# Patient Record
Sex: Female | Born: 1995 | Hispanic: No | Marital: Single | Smoking: Never smoker
Health system: Southern US, Community
[De-identification: ages and names within clinical notes are randomized; demographics above are authoritative.]

---
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
## 2016-05-25 ENCOUNTER — Encounter: Payer: Self-pay | Admitting: Gynecology

## 2016-06-10 ENCOUNTER — Encounter (HOSPITAL_BASED_OUTPATIENT_CLINIC_OR_DEPARTMENT_OTHER): Payer: Self-pay | Admitting: Emergency Medicine

## 2016-06-10 ENCOUNTER — Emergency Department (HOSPITAL_BASED_OUTPATIENT_CLINIC_OR_DEPARTMENT_OTHER): Payer: No Typology Code available for payment source

## 2016-06-10 ENCOUNTER — Emergency Department (HOSPITAL_BASED_OUTPATIENT_CLINIC_OR_DEPARTMENT_OTHER)
Admission: EM | Admit: 2016-06-10 | Discharge: 2016-06-11 | Disposition: A | Discharge status: Home / Self Care | Payer: No Typology Code available for payment source | Attending: Emergency Medicine | Admitting: Emergency Medicine

## 2016-06-10 DIAGNOSIS — S1980XA Other specified injuries of unspecified part of neck, initial encounter: Secondary | ICD-10-CM | POA: Diagnosis not present

## 2016-06-10 DIAGNOSIS — Y939 Activity, unspecified: Secondary | ICD-10-CM | POA: Diagnosis not present

## 2016-06-10 DIAGNOSIS — Y92410 Unspecified street and highway as the place of occurrence of the external cause: Secondary | ICD-10-CM | POA: Diagnosis not present

## 2016-06-10 DIAGNOSIS — Y999 Unspecified external cause status: Secondary | ICD-10-CM | POA: Diagnosis not present

## 2016-06-10 DIAGNOSIS — S199XXA Unspecified injury of neck, initial encounter: Secondary | ICD-10-CM | POA: Diagnosis present

## 2016-06-10 LAB — PREGNANCY, URINE: Preg Test, Ur: NEGATIVE

## 2016-06-10 MED ORDER — IBUPROFEN 400 MG PO TABS
600.0000 mg | ORAL_TABLET | Freq: Once | ORAL | Status: AC
  Administered 2016-06-10: 600 mg via ORAL
  Filled 2016-06-10: qty 1
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
## 2016-06-11 MED ORDER — METHOCARBAMOL 500 MG PO TABS: 500.0000 mg | ORAL_TABLET | Freq: Two times a day (BID) | ORAL | 0 refills | Status: DC

## 2016-06-11 MED ORDER — IBUPROFEN 600 MG PO TABS: 600.0000 mg | ORAL_TABLET | Freq: Four times a day (QID) | ORAL | 0 refills | Status: DC | PRN

## 2016-06-11 MED ORDER — ACETAMINOPHEN 500 MG PO TABS: 500.0000 mg | ORAL_TABLET | Freq: Four times a day (QID) | ORAL | 0 refills | Status: DC | PRN
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
## 2016-06-11 NOTE — ED Notes (Signed)
Pt verbalizes understanding of d/c instructions and denies any further needs at this time. 

## 2016-06-13 ENCOUNTER — Encounter (HOSPITAL_COMMUNITY): Payer: Self-pay | Admitting: *Deleted

## 2016-06-13 ENCOUNTER — Emergency Department (HOSPITAL_COMMUNITY)
Admission: EM | Admit: 2016-06-13 | Discharge: 2016-06-17 | Disposition: A | Discharge status: Other Acute Inpatient Hospital | Payer: No Typology Code available for payment source | Attending: Emergency Medicine | Admitting: Emergency Medicine

## 2016-06-13 DIAGNOSIS — Z046 Encounter for general psychiatric examination, requested by authority: Secondary | ICD-10-CM | POA: Diagnosis not present

## 2016-06-13 DIAGNOSIS — F333 Major depressive disorder, recurrent, severe with psychotic symptoms: Secondary | ICD-10-CM | POA: Diagnosis not present

## 2016-06-13 DIAGNOSIS — F918 Other conduct disorders: Secondary | ICD-10-CM | POA: Diagnosis present

## 2016-06-13 DIAGNOSIS — Z79899 Other long term (current) drug therapy: Secondary | ICD-10-CM | POA: Diagnosis not present

## 2016-06-13 DIAGNOSIS — F323 Major depressive disorder, single episode, severe with psychotic features: Secondary | ICD-10-CM | POA: Diagnosis present

## 2016-06-13 DIAGNOSIS — R45851 Suicidal ideations: Secondary | ICD-10-CM | POA: Diagnosis not present

## 2016-06-13 LAB — ETHANOL: Alcohol, Ethyl (B): <5 mg/dL (ref <5)

## 2016-06-13 LAB — COMPREHENSIVE METABOLIC PANEL
ALT: 15 U/L (ref 14–54)
AST: 24 U/L (ref 15–41)
Albumin: 5.3 g/dL — ABNORMAL HIGH (ref 3.5–5.0)
Alkaline Phosphatase: 59 U/L (ref 38–126)
Anion gap: 12 (ref 5–15)
BUN: 17 mg/dL (ref 6–20)
CO2: 22 mmol/L (ref 22–32)
Calcium: 9.8 mg/dL (ref 8.9–10.3)
Chloride: 107 mmol/L (ref 101–111)
Creatinine, Ser: 0.83 mg/dL (ref 0.44–1.00)
GFR calc Af Amer: >60 mL/min (ref >60)
GFR calc non Af Amer: >60 mL/min (ref >60)
Glucose, Bld: 106 mg/dL — ABNORMAL HIGH (ref 65–99)
Potassium: 3.4 mmol/L — ABNORMAL LOW (ref 3.5–5.1)
Sodium: 141 mmol/L (ref 135–145)
Total Bilirubin: 2.7 mg/dL — ABNORMAL HIGH (ref 0.3–1.2)
Total Protein: 8.4 g/dL — ABNORMAL HIGH (ref 6.5–8.1)

## 2016-06-13 LAB — CBC
HCT: 42.6 % (ref 36.0–46.0)
Hemoglobin: 14.8 g/dL (ref 12.0–15.0)
MCH: 31.1 pg (ref 26.0–34.0)
MCHC: 34.7 g/dL (ref 30.0–36.0)
MCV: 89.5 fL (ref 78.0–100.0)
Platelets: 226 10*3/uL (ref 150–400)
RBC: 4.76 MIL/uL (ref 3.87–5.11)
RDW: 12.7 % (ref 11.5–15.5)
WBC: 6.8 10*3/uL (ref 4.0–10.5)

## 2016-06-13 LAB — RAPID URINE DRUG SCREEN, HOSP PERFORMED
Amphetamines: NOT DETECTED
Barbiturates: NOT DETECTED
Benzodiazepines: NOT DETECTED
Cocaine: NOT DETECTED
Opiates: NOT DETECTED
Tetrahydrocannabinol: NOT DETECTED

## 2016-06-13 LAB — SALICYLATE LEVEL: Salicylate Lvl: <7 mg/dL (ref 2.8–30.0)

## 2016-06-13 LAB — ACETAMINOPHEN LEVEL: Acetaminophen (Tylenol), Serum: <10 ug/mL — ABNORMAL LOW (ref 10–30)
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
## 2016-06-13 NOTE — ED Provider Notes (Signed)
[facility]-EMERGENCY DEPT Provider Note   CSN: [id number] Arrival date & time: 06/13/16  1623     History   Chief Complaint Chief Complaint  Patient presents with  . Medical Clearance    HPI Deborah Harris is a 21 y.o. female.  Per ivc paperwork, pt here under ivc by her mother due to inability to care for herself, agitation, and spitting on people Pt allegedly tried to get a knife to harm herself but was stopped  She states that she is responding to internal stimuli While attempting to get a direct hx from the patient she would only respond that she is ok      History reviewed. No pertinent past medical history.  Patient Active Problem List   Diagnosis Date Noted  . Acne 05/21/2012  . Ovarian cyst, right 02/27/2012    History reviewed. No pertinent surgical history.  OB History    Gravida Para Term Preterm AB Living   0             SAB TAB Ectopic Multiple Live Births                   Home Medications    Prior to Admission medications   Medication Sig Start Date End Date Taking? Authorizing Provider  acetaminophen (TYLENOL) 500 MG tablet Take 1 tablet (500 mg total) by mouth every 6 (six) hours as needed. 06/11/16   Law, Waylan BogaAlexandra M, PA-C  drospirenone-ethinyl estradiol (YASMIN,ZARAH,SYEDA) 3-0.03 MG tablet Take 1 tablet by mouth daily. 05/21/12   Ok EdwardsFernandez, Juan H, MD  ibuprofen (ADVIL,MOTRIN) 600 MG tablet Take 1 tablet (600 mg total) by mouth every 6 (six) hours as needed. 06/11/16   Law, Waylan BogaAlexandra M, PA-C  methocarbamol (ROBAXIN) 500 MG tablet Take 1 tablet (500 mg total) by mouth 2 (two) times daily. 06/11/16   Emi HolesLaw, Alexandra M, PA-C    Family History Family History  Problem Relation Age of Onset  . Diabetes Maternal Grandmother     Social History Social History  Substance Use Topics  . Smoking status: Never Smoker  . Smokeless tobacco: Never Used  . Alcohol use No     Allergies   Patient has no known allergies.   Review of Systems Review of Systems   Unable to perform ROS: Psychiatric disorder     Physical Exam Updated Vital Signs BP 140/85 (BP Location: Right Arm)   Pulse (!) 110   Temp 98.9 F (37.2 C) (Oral)   Resp 18   SpO2 96%   Physical Exam  Constitutional: She is oriented to person, place, and time. She appears well-developed and well-nourished.  Non-toxic appearance. No distress.  HENT:  Head: Normocephalic and atraumatic.  Eyes: Conjunctivae, EOM and lids are normal. Pupils are equal, round, and reactive to light.  Neck: Normal range of motion. Neck supple. No tracheal deviation present. No thyroid mass present.  Cardiovascular: Normal rate, regular rhythm and normal heart sounds.  Exam reveals no gallop.   No murmur heard. Pulmonary/Chest: Effort normal and breath sounds normal. No stridor. No respiratory distress. She has no decreased breath sounds. She has no wheezes. She has no rhonchi. She has no rales.  Abdominal: Soft. Normal appearance and bowel sounds are normal. She exhibits no distension. There is no tenderness. There is no rebound and no CVA tenderness.  Musculoskeletal: Normal range of motion. She exhibits no edema or tenderness.  Neurological: She is alert and oriented to person, place, and time. She has normal strength. No cranial  nerve deficit or sensory deficit. GCS eye subscore is 4. GCS verbal subscore is 5. GCS motor subscore is 6.  Skin: Skin is warm and dry. No abrasion and no rash noted.  Psychiatric: Her affect is inappropriate. Her speech is delayed. She is withdrawn. She expresses inappropriate judgment. She expresses no suicidal plans and no homicidal plans. She is inattentive.  Nursing note and vitals reviewed.    ED Treatments / Results  Labs (all labs ordered are listed, but only abnormal results are displayed) Labs Reviewed  COMPREHENSIVE METABOLIC PANEL  ETHANOL  SALICYLATE LEVEL  ACETAMINOPHEN LEVEL  CBC  RAPID URINE DRUG SCREEN, HOSP PERFORMED    EKG  EKG  Interpretation None       Radiology No results found.  Procedures Procedures (including critical care time)  Medications Ordered in ED Medications - No data to display   Initial Impression / Assessment and Plan / ED Course  I have reviewed the triage vital signs and the nursing notes.  Pertinent labs & imaging results that were available during my care of the patient were reviewed by me and considered in my medical decision making (see chart for details).     Medical screening labs are pending and pt will likely be medically cleared and then referred to [facility]  Final Clinical Impressions(s) / ED Diagnoses   Final diagnoses:  None    New Prescriptions New Prescriptions   No medications on file     Lorre Nick, MD 06/13/16 1701

## 2016-06-13 NOTE — ED Triage Notes (Signed)
Patient is brought in by [employer]. Patient's mother IVC patient because of her not eating, sleeping, and not taking care of her personal hygiene. Patient has been spitting on people and expressing self hate. Patient denies visual or auditory hallucinations. Patient says "I am ok". She is deferring questions and has a blank stare.

## 2016-06-14 DIAGNOSIS — F323 Major depressive disorder, single episode, severe with psychotic features: Secondary | ICD-10-CM | POA: Diagnosis not present

## 2016-06-14 DIAGNOSIS — R45851 Suicidal ideations: Secondary | ICD-10-CM

## 2016-06-14 LAB — I-STAT BETA HCG BLOOD, ED (MC, WL, AP ONLY): I-stat hCG, quantitative: <5 m[IU]/mL (ref <5)

## 2016-06-14 LAB — CBG MONITORING, ED: GLUCOSE-CAPILLARY: 71 mg/dL (ref 65–99)

## 2016-06-14 MED ORDER — LORAZEPAM 2 MG/ML IJ SOLN
1.0000 mg | INTRAMUSCULAR | Status: AC
  Administered 2016-06-14 (×2): 1 mg via INTRAVENOUS
  Filled 2016-06-14 (×2): qty 1

## 2016-06-14 MED ORDER — KCL IN DEXTROSE-NACL 20-5-0.45 MEQ/L-%-% IV SOLN
Freq: Once | INTRAVENOUS | Status: AC
  Administered 2016-06-14: 13:00:00 via INTRAVENOUS
  Filled 2016-06-14: qty 1000

## 2016-06-14 MED ORDER — RISPERIDONE 0.5 MG PO TABS
0.2500 mg | ORAL_TABLET | Freq: Two times a day (BID) | ORAL | Status: DC
  Filled 2016-06-14 (×2): qty 1

## 2016-06-14 MED ORDER — SODIUM CHLORIDE 0.9 % IV BOLUS (SEPSIS)
1000.0000 mL | Freq: Once | INTRAVENOUS | Status: AC
  Administered 2016-06-14: 1000 mL via INTRAVENOUS

## 2016-06-14 MED ORDER — LORAZEPAM 1 MG PO TABS: 1.0000 mg | ORAL_TABLET | ORAL | Status: DC

## 2016-06-14 MED ORDER — LORAZEPAM 2 MG/ML IJ SOLN: 2.0000 mg | INTRAMUSCULAR | Status: DC

## 2016-06-14 MED ORDER — SODIUM CHLORIDE 0.9 % IV SOLN: 1000.0000 mL | INTRAVENOUS | Status: DC

## 2016-06-14 MED ORDER — LORAZEPAM 1 MG PO TABS: 1.0000 mg | ORAL_TABLET | Freq: Four times a day (QID) | ORAL | Status: DC | PRN

## 2016-06-14 MED ORDER — CITALOPRAM HYDROBROMIDE 10 MG PO TABS
10.0000 mg | ORAL_TABLET | Freq: Every day | ORAL | Status: DC
  Administered 2016-06-17: 10 mg via ORAL
  Filled 2016-06-14 (×3): qty 1

## 2016-06-14 NOTE — ED Provider Notes (Signed)
Notified that pt is still not eating or drinking well.   Will order IV fluids considering the tachycardia.  Switch to d5 1/2 normal saline once the bolus is complete.   Linwood DibblesKnapp, Marvelous Woolford, MD 06/14/16 1038

## 2016-06-14 NOTE — ED Notes (Signed)
Pt became tearful in the room. When asked why she was crying, she said, "the curtain." When asked if she was upset that it was open, patient nodded her head in agreement. When this writer asked the patient why she wanted the curtain closed she said, "my sins." When asked what her sins were, patient did not answer. Patient stated her side hurt but could not tell me which one. Patient given sprite per request, tissues also given.
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
## 2016-06-14 NOTE — ED Notes (Signed)
Patient now talking sitting up in bed sipping water, refusing her medications.  Deborah MeansJamison Lord NP notified.  Patient reports the IV is making her feel like she is not in control of her body.

## 2016-06-15 LAB — URINALYSIS, ROUTINE W REFLEX MICROSCOPIC
Bilirubin Urine: NEGATIVE
Glucose, UA: NEGATIVE mg/dL
HGB URINE DIPSTICK: NEGATIVE
Ketones, ur: 5 mg/dL — AB
LEUKOCYTES UA: NEGATIVE
Nitrite: NEGATIVE
PROTEIN: NEGATIVE mg/dL
SPECIFIC GRAVITY, URINE: 1.005 (ref 1.005–1.030)
pH: 7 (ref 5.0–8.0)

## 2016-06-15 LAB — CBC WITH DIFFERENTIAL/PLATELET
BASOS ABS: 0 10*3/uL (ref 0.0–0.1)
Basophils Relative: 1 %
EOS ABS: 0.1 10*3/uL (ref 0.0–0.7)
EOS PCT: 1 %
HCT: 42.7 % (ref 36.0–46.0)
Hemoglobin: 14.9 g/dL (ref 12.0–15.0)
LYMPHS ABS: 1.8 10*3/uL (ref 0.7–4.0)
LYMPHS PCT: 32 %
MCH: 30.5 pg (ref 26.0–34.0)
MCHC: 34.9 g/dL (ref 30.0–36.0)
MCV: 87.5 fL (ref 78.0–100.0)
Monocytes Absolute: 0.5 10*3/uL (ref 0.1–1.0)
Monocytes Relative: 9 %
Neutro Abs: 3.2 10*3/uL (ref 1.7–7.7)
Neutrophils Relative %: 57 %
PLATELETS: 188 10*3/uL (ref 150–400)
RBC: 4.88 MIL/uL (ref 3.87–5.11)
RDW: 12.1 % (ref 11.5–15.5)
WBC: 5.5 10*3/uL (ref 4.0–10.5)

## 2016-06-15 LAB — BASIC METABOLIC PANEL
Anion gap: 9 (ref 5–15)
BUN: 10 mg/dL (ref 6–20)
CHLORIDE: 107 mmol/L (ref 101–111)
CO2: 22 mmol/L (ref 22–32)
Calcium: 9.1 mg/dL (ref 8.9–10.3)
Creatinine, Ser: 0.66 mg/dL (ref 0.44–1.00)
Glucose, Bld: 85 mg/dL (ref 65–99)
POTASSIUM: 3.5 mmol/L (ref 3.5–5.1)
SODIUM: 138 mmol/L (ref 135–145)

## 2016-06-15 MED ORDER — SODIUM CHLORIDE 0.9 % IV BOLUS (SEPSIS)
1000.0000 mL | Freq: Once | INTRAVENOUS | Status: AC
  Administered 2016-06-15: 1000 mL via INTRAVENOUS

## 2016-06-15 MED ORDER — DEXTROSE-NACL 5-0.45 % IV SOLN
INTRAVENOUS | Status: DC
  Administered 2016-06-16: 13:00:00 via INTRAVENOUS
  Administered 2016-06-16 – 2016-06-17 (×2): 1000 mL via INTRAVENOUS

## 2016-06-15 MED ORDER — RISPERIDONE 0.5 MG PO TABS
0.5000 mg | ORAL_TABLET | Freq: Two times a day (BID) | ORAL | Status: DC
  Administered 2016-06-17: 0.5 mg via ORAL
  Filled 2016-06-15 (×3): qty 1

## 2016-06-15 NOTE — ED Notes (Addendum)
Introduced self to patient. Pt oriented to unit expectations.  Assessed pt for:  A) Anxiety &/or agitation: On admission to the [facility] pt fell asleep again immediately and did not wake up when introductions were made.   S) Safety: Safety maintained with q-15-minute checks and hourly rounds by staff.  A) ADLs: Pt able to perform ADLs independently.  P) Pick-Up (room cleanliness): Pt's room clean and free of clutter.

## 2016-06-15 NOTE — ED Notes (Addendum)
SBAR Report received from previous nurse. Pt received unresponsive on unit. Pt gave no answers current SI/ HI, A/V H, depression, anxiety, or pain at this time, and gave it was reported to writer that pt has not eaten, drank, or been out of bed for any reason prior to shift change.  Pt did not respond to sternal rub, or nail bed pressure and only opened her eyes when staff repeatedly shined pen light into them. EDP notified and pt will be transferring back to TCU.

## 2016-06-15 NOTE — ED Notes (Signed)
Pt cleaned d/t menses.  Pt assisted with rolling when requested but eyes remained closed.

## 2016-06-15 NOTE — ED Notes (Signed)
Pt now with eyes open stating "I need to talk to [place].  She has my phone in her pocket."  Pt informed staff is not allowed to keep pt's personal items.  All personal items are locked up.  Pt attempting to come out of room stating "Can I go back to my other room and look."  Informed pt is not able to go back to previous room.  Pt redirected back into room.

## 2016-06-15 NOTE — ED Notes (Signed)
Pt in bed with eyes closed and not responding to verbal prompts. She did not eat lunch or dinner and has not been up to the bathroom. She did not resist, or move at all, when blood pressure cuff and pulse ox were put on her arm and finger. Skin is warm and dry. Rise and fall of chest is shallow requiring close examination and observation.

## 2016-06-15 NOTE — ED Provider Notes (Addendum)
[facility] that the patient is still catatonic. The common that she has been sway since early in the morning. She is incontinent of urine and not responding or eating on her own. When I evaluated her she opens her eyes and keeps them open a little bit but specifically looks away from me. She then closes them and opens them spontaneously. She does not respond to commands but is breathing normally. 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 Seems like she had a similar episode yesterday that required IV fluids. At this point, we'll give IV fluids and check lab work given the decreased oral intake.   Pricilla LovelessGoldston, Stefhanie Kachmar, MD 06/15/16 2016  Labwork unremarkable save for minimal urine ketones. Still not eating. Is awake but not eating. Will keep on fluids overnight, psych will need to reeval in AM. This appears to be psychiatric in origin   Pricilla LovelessGoldston, Kaly Mcquary, MD 06/15/16 2351

## 2016-06-15 NOTE — ED Notes (Signed)
Per TTS patient is being held while placement is being seeked.

## 2016-06-15 NOTE — Consult Note (Signed)
[facility] Psychiat[facility] Consult   Reason for Consult:  Bizarre behavior, psychosis and depressed Referring Physician:  EDP Patient Identification: Deborah Harris MRN:  [id number] Principal Diagnosis: Major depressive disorder with psychotic features [facility]) Diagnosis:   Patient Active Problem List   Diagnosis Date Noted  . Major depressive disorder with psychotic features ([place]) [F32.3] 06/14/2016    Priority: Medium  . Acne [L70.9] 05/21/2012  . Ovarian cyst, right [N83.201] 02/27/2012    Total Time spent with patient: 30 minutes  Subjective:   '' I don't want my family to come to see me at the hospital and if they do don't give them any information about me.''  Objective:  Patient was seen, interviewed, chart reviewed and plan discussed with the treatment team. Patient's behavior remains bizarre, has not been taking care of personal hygiene, getting easily agitated and irritable. Patient reports worsening depression, feeling hopeless and hearing voices telling her about her past mistakes. Patient now is now eating but she remains withdrawn and self isolated.  Past Psychiatric History:   Risk to Self: Suicidal Ideation: No Suicidal Intent: No Is patient at risk for suicide?: Yes Suicidal Plan?: No Access to Means: No What has been your use of drugs/alcohol within the last 12 months?: Unknown.  UDS and BAL clear How many times?:  (Unknown really.) Other Self Harm Risks: Not eating. Triggers for Past Attempts: Unknown Intentional Self Injurious Behavior: Damaging (Pt has not been eating, sleeping or taking care of herself.) Comment - Self Injurious Behavior: Unknown Risk to Others: Homicidal Ideation: No Thoughts of Harm to Others: No Current Homicidal Intent: No Current Homicidal Plan: No Access to Homicidal Means: No Identified Victim: No one History of harm to others?: Yes Assessment of Violence: On admission Violent Behavior Description: Spitting and pushing people  trying to help her. Does patient have access to weapons?: No Criminal Charges Pending?: No Does patient have a court date: No Prior Inpatient Therapy: Prior Inpatient Therapy:  (Unknown) Prior Therapy Dates: Unknown Prior Therapy Facilty/Provider(s): Unknown Reason for Treatment: Unknown Prior Outpatient Therapy: Prior Outpatient Therapy:  (Unknown) Prior Therapy Dates: Unknown Prior Therapy Facilty/Provider(s): Unknown Reason for Treatment: Unknown Does patient have an ACCT team?: Unknown Does patient have Intensive In-House Services?  : Unknown Does patient have [facility] services? : Unknown Does patient have P4CC services?: Unknown  Past Medical History: History reviewed. No pertinent past medical history. History reviewed. No pertinent surgical history. Family History:  Family History  Problem Relation Age of Onset  . Diabetes Maternal Grandmother    Family Psychiatric  History: Social History:  History  Alcohol Use No     History  Drug Use No    Social History   Social History  . Marital status: Single    Spouse name: N/A  . Number of children: N/A  . Years of education: N/A   Social History Main Topics  . Smoking status: Never Smoker  . Smokeless tobacco: Never Used  . Alcohol use No  . Drug use: No  . Sexual activity: Yes   Other Topics Concern  . None   Social History Narrative  . None   Additional Social History:    Allergies:  No Known Allergies  Labs:  Results for orders placed or performed during the hospital encounter of 06/13/16 (from the past 48 hour(s))  Comprehensive metabolic panel     Status: Abnormal   Collection Time: 06/13/16  4:34 PM  Result Value Ref Range   Sodium 141 135 - 145  mmol/L   Potassium 3.4 (L) 3.5 - 5.1 mmol/L   Chloride 107 101 - 111 mmol/L   CO2 22 22 - 32 mmol/L   Glucose, Bld 106 (H) 65 - 99 mg/dL   BUN 17 6 - 20 mg/dL   Creatinine, Ser 0.83 0.44 - 1.00 mg/dL   Calcium 9.8 8.9 - 10.3 mg/dL   Total Protein 8.4  (H) 6.5 - 8.1 g/dL   Albumin 5.3 (H) 3.5 - 5.0 g/dL   AST 24 15 - 41 U/L   ALT 15 14 - 54 U/L   Alkaline Phosphatase 59 38 - 126 U/L   Total Bilirubin 2.7 (H) 0.3 - 1.2 mg/dL   GFR calc non Af Amer >60 >60 mL/min   GFR calc Af Amer >60 >60 mL/min    Comment: (NOTE) The eGFR has been calculated using the CKD EPI equation. This calculation has not been validated in all clinical situations. eGFR's persistently <60 mL/min signify possible Chronic Kidney Disease.    Anion gap 12 5 - 15  Ethanol     Status: None   Collection Time: 06/13/16  4:34 PM  Result Value Ref Range   Alcohol, Ethyl (B) <5 <5 mg/dL    Comment:        LOWEST DETECTABLE LIMIT FOR SERUM ALCOHOL IS 5 mg/dL FOR MEDICAL PURPOSES ONLY   Salicylate level     Status: None   Collection Time: 06/13/16  4:34 PM  Result Value Ref Range   Salicylate Lvl <2.7 2.8 - 30.0 mg/dL  Acetaminophen level     Status: Abnormal   Collection Time: 06/13/16  4:34 PM  Result Value Ref Range   Acetaminophen (Tylenol), Serum <10 (L) 10 - 30 ug/mL    Comment:        THERAPEUTIC CONCENTRATIONS VARY SIGNIFICANTLY. A RANGE OF 10-30 ug/mL MAY BE AN EFFECTIVE CONCENTRATION FOR MANY PATIENTS. HOWEVER, SOME ARE BEST TREATED AT CONCENTRATIONS OUTSIDE THIS RANGE. ACETAMINOPHEN CONCENTRATIONS >150 ug/mL AT 4 HOURS AFTER INGESTION AND >50 ug/mL AT 12 HOURS AFTER INGESTION ARE OFTEN ASSOCIATED WITH TOXIC REACTIONS.   cbc     Status: None   Collection Time: 06/13/16  4:34 PM  Result Value Ref Range   WBC 6.8 4.0 - 10.5 K/uL   RBC 4.76 3.87 - 5.11 MIL/uL   Hemoglobin 14.8 12.0 - 15.0 g/dL   HCT 42.6 36.0 - 46.0 %   MCV 89.5 78.0 - 100.0 fL   MCH 31.1 26.0 - 34.0 pg   MCHC 34.7 30.0 - 36.0 g/dL   RDW 12.7 11.5 - 15.5 %   Platelets 226 150 - 400 K/uL  Rapid urine drug screen (hospital performed)     Status: None   Collection Time: 06/13/16  4:34 PM  Result Value Ref Range   Opiates NONE DETECTED NONE DETECTED   Cocaine NONE DETECTED  NONE DETECTED   Benzodiazepines NONE DETECTED NONE DETECTED   Amphetamines NONE DETECTED NONE DETECTED   Tetrahydrocannabinol NONE DETECTED NONE DETECTED   Barbiturates NONE DETECTED NONE DETECTED    Comment:        DRUG SCREEN FOR MEDICAL PURPOSES ONLY.  IF CONFIRMATION IS NEEDED FOR ANY PURPOSE, NOTIFY LAB WITHIN 5 DAYS.        LOWEST DETECTABLE LIMITS FOR URINE DRUG SCREEN Drug Class       Cutoff (ng/mL) Amphetamine      1000 Barbiturate      200 Benzodiazepine   035 Tricyclics       009  Opiates          300 Cocaine          300 THC              50   CBG monitoring, ED     Status: None   Collection Time: 06/14/16 10:35 AM  Result Value Ref Range   Glucose-Capillary 71 65 - 99 mg/dL   Comment 1 Notify RN    Comment 2 Document in Chart   I-Stat Beta hCG blood, ED ([facility], [facility], [facility] only)     Status: None   Collection Time: 06/14/16 11:05 AM  Result Value Ref Range   I-stat hCG, quantitative <5.0 <5 mIU/mL   Comment 3            Comment:   GEST. AGE      CONC.  (mIU/mL)   <=1 WEEK        5 - 50     2 WEEKS       50 - 500     3 WEEKS       100 - 10,000     4 WEEKS     1,000 - 30,000        FEMALE AND NON-PREGNANT FEMALE:     LESS THAN 5 mIU/mL     Current Facility-Administered Medications  Medication Dose Route Frequency Provider Last Rate Last Dose  . citalopram (CELEXA) tablet 10 mg  10 mg Oral Daily Kamarrion Stfort, MD      . LORazepam (ATIVAN) tablet 1 mg  1 mg Oral Q6H PRN Sharla Tankard, MD      . risperiDONE (RISPERDAL) tablet 0.25 mg  0.25 mg Oral BID Corena Pilgrim, MD       No current outpatient prescriptions on file.    Musculoskeletal: Strength & Muscle Tone: within normal limits Gait & Station: unable to stand Patient leans: N/A  Psychiatric Specialty Exam: Physical Exam  Psychiatric: Her affect is blunt. She is withdrawn and actively hallucinating. Cognition and memory are normal. She expresses impulsivity. She exhibits a depressed mood. She  expresses suicidal ideation. She is noncommunicative.    Review of Systems  Constitutional: Positive for malaise/fatigue and weight loss.  HENT: Negative.   Eyes: Negative.   Respiratory: Negative.   Cardiovascular: Negative.   Musculoskeletal: Negative.   Skin: Negative.   Neurological: Negative.   Endo/Heme/Allergies: Negative.   Psychiatric/Behavioral: Positive for depression, hallucinations and suicidal ideas. The patient is nervous/anxious.     Blood pressure (!) 104/57, pulse (!) 109, temperature 99.6 F (37.6 C), temperature source Oral, resp. rate 20, SpO2 99 %.There is no height or weight on file to calculate BMI.  General Appearance: Casual  Eye Contact:  Poor  Speech:  non-communicative  Volume:  Decreased  Mood:  Depressed, Dysphoric and Hopeless  Affect:  Constricted  Thought Process:  Disorganized  Orientation:  Other:  unable to assess  Thought Content:  Hallucinations: Auditory  Suicidal Thoughts:  Yes.  with intent/plan  Homicidal Thoughts:  No  Memory:  unable to assess  Judgement:  Poor  Insight:  Lacking  Psychomotor Activity:  Psychomotor Retardation  Concentration:  Concentration: Poor and Attention Span: Poor  Recall:  unable to assess  Fund of Knowledge:  unable to assess  Language:  Good  Akathisia:  No  Handed:  Right  AIMS (if indicated):     Assets:  Social Support  ADL's:  Impaired  Cognition:  WNL  Sleep:   poor  Treatment Plan Summary: Daily contact with patient to assess and evaluate symptoms and progress in treatment and Medication management  Continue  Celexa 10 mg daily for depression, Risperdal 0.25 mg bid and Lorazepam 1 mg q6h prn for agitation.  Disposition: Recommend psychiatric Inpatient admission when medically cleared.  Corena Pilgrim, MD 06/15/2016 11:31 AM

## 2016-06-15 NOTE — ED Notes (Signed)
Dr. Criss AlvineGoldston informed pt is on her cycle.  Received verbal order for in and out cath.

## 2016-06-15 NOTE — Progress Notes (Signed)
06/15/16 1345:  LRT went to pt room to offer activities, pt was sleep.  Caroll RancherMarjette Ferris Tally, LRT/CTRS

## 2016-06-15 NOTE — BH Assessment (Signed)
[facility] Assessment Progress Note  Per Thedore MinsMojeed Akintayo, MD, this pt requires psychiatric hospitalization.  Deborah Heinrichina Tate, RN, [facility]; they will be ready to receive pt at 15:00.  Pt presents under IVC initiated by her mother, and upheld by Dr Jannifer FranklinAkintayo, and [facility]  Pt's nurse, Diane, has been notified, and agrees to call report to [phone].  Pt is to be transported via [employer].   [name], MA Triage Specialist [phone]

## 2016-06-15 NOTE — ED Notes (Signed)
Pt was sleeping so deeply that we could not see her chest rise and fall. This Clinical research associatewriter shook her arm until she opened her eyes. She appeared to be disoriented. She insisted that this Clinical research associatewriter is not her nurse, that her nurse is Aram BeechamCynthia and did not seem able to comprehend otherwise. She could not seem to comprehend explanation about HIPPA, so her family will not be told anything [facility].

## 2016-06-15 NOTE — ED Notes (Signed)
When cleaning pt it was noted the blood is menstrual.

## 2016-06-15 NOTE — ED Notes (Addendum)
Pt stated "I don't need the medicine.  I'm good right now.   I've been having a hard time recently with my faith.  I've repented.  I didn't tell anyone but my boyfriend."

## 2016-06-15 NOTE — ED Notes (Signed)
Pt did not eat lunch or dinner; she has not been up to the bathroom.

## 2016-06-15 NOTE — ED Notes (Signed)
Pt out of room stating "I want to go to another restroom."  Pt directed to her BR.  Pt stated "that BR's dirty."  Informed pt, room & BR was cleaned prior to her arrival.  Pt escorted to hallway BR.  Pt did not use it and stated "I can't go to the BR right now."  Pt offered food.  Pt stated "I ate earlier.  No thanks."

## 2016-06-15 NOTE — ED Notes (Signed)
Mother at bedside wanting information regarding daughter's status. Per daughter she does not want any information released to the mother. Mother began crying and stated she just wants to know what the doctor said and attempted to touch and console the patient. Patient verbalized "Please do not put your hands on me mother and stop crying, why cry now when you wasn't before." patient looked at me and stated "nurse she can leave now." Patient was able to verbalize appropriate name, dob, and her location.

## 2016-06-16 DIAGNOSIS — R45851 Suicidal ideations: Secondary | ICD-10-CM | POA: Diagnosis not present

## 2016-06-16 DIAGNOSIS — F333 Major depressive disorder, recurrent, severe with psychotic symptoms: Secondary | ICD-10-CM

## 2016-06-16 MED ORDER — LORAZEPAM 2 MG/ML IJ SOLN
1.0000 mg | INTRAMUSCULAR | Status: AC
  Administered 2016-06-16 (×2): 1 mg via INTRAVENOUS
  Filled 2016-06-16 (×2): qty 1

## 2016-06-16 NOTE — ED Notes (Signed)
Grandmother at bedside.  Pt calm.

## 2016-06-16 NOTE — ED Provider Notes (Signed)
I was called to the bedside because patient was "catatonic." On exam patient is resting bed. She is arousable to gentle tactile stimulus. She follows simple commands and gets up and walks around the bed on command without difficulty on asking how she feels she states "ready to go" no further intervention needed presently   Doug SouJacubowitz, Ever Halberg, MD 06/16/16 [phone]

## 2016-06-16 NOTE — Consult Note (Signed)
[facility] Consult   Reason for Consult:  Depression with suicide attempt Referring Physician:  EDP Patient Identification: Deborah Harris MRN:  [id number] Principal Diagnosis: Major depressive disorder with psychotic features [facility]) Diagnosis:   Patient Active Problem List   Diagnosis Date Noted  . Major depressive disorder with psychotic features ([place]) [F32.3] 06/14/2016    Priority: High  . Acne [L70.9] 05/21/2012  . Ovarian cyst, right [N83.201] 02/27/2012    Total Time spent with patient: 45 minutes  Subjective:   Deborah Harris is a 21 y.o. female patient admitted with depression, suicide attempt, catatonia  HPI:  21 yo female who presented to the ED with depression and agitation, initially.  She evidently got agitated at home and they brought her to the ED.  Here she goes in and out of catatonia, minimal eating and drinking.  Stares often in space, slow to respond.  No homicidal ideations or alcohol/drug abuse.  Believes her behaviors are a result of her confessing her sin to God and her boyfriend.  Inpatient psychiatric care needed.  Past Psychiatric History: none  Risk to Self: Suicidal Ideation: No Suicidal Intent: No Is patient at risk for suicide?: Yes Suicidal Plan?: No Access to Means: No What has been your use of drugs/alcohol within the last 12 months?: Unknown.  UDS and BAL clear How many times?:  (Unknown really.) Other Self Harm Risks: Not eating. Triggers for Past Attempts: Unknown Intentional Self Injurious Behavior: Damaging (Pt has not been eating, sleeping or taking care of herself.) Comment - Self Injurious Behavior: Unknown Risk to Others: Homicidal Ideation: No Thoughts of Harm to Others: No Current Homicidal Intent: No Current Homicidal Plan: No Access to Homicidal Means: No Identified Victim: No one History of harm to others?: Yes Assessment of Violence: On admission Violent Behavior Description: Spitting and pushing people trying  to help her. Does patient have access to weapons?: No Criminal Charges Pending?: No Does patient have a court date: No Prior Inpatient Therapy: Prior Inpatient Therapy:  (Unknown) Prior Therapy Dates: Unknown Prior Therapy Facilty/Provider(s): Unknown Reason for Treatment: Unknown Prior Outpatient Therapy: Prior Outpatient Therapy:  (Unknown) Prior Therapy Dates: Unknown Prior Therapy Facilty/Provider(s): Unknown Reason for Treatment: Unknown Does patient have an ACCT team?: Unknown Does patient have Intensive In-House Services?  : Unknown Does patient have [facility] services? : Unknown Does patient have P4CC services?: Unknown  Past Medical History: History reviewed. No pertinent past medical history. History reviewed. No pertinent surgical history. Family History:  Family History  Problem Relation Age of Onset  . Diabetes Maternal Grandmother    Family Psychiatric  History: unknown Social History:  History  Alcohol Use No     History  Drug Use No    Social History   Social History  . Marital status: Single    Spouse name: N/A  . Number of children: N/A  . Years of education: N/A   Social History Main Topics  . Smoking status: Never Smoker  . Smokeless tobacco: Never Used  . Alcohol use No  . Drug use: No  . Sexual activity: Yes   Other Topics Concern  . None   Social History Narrative  . None   Additional Social History:    Allergies:  No Known Allergies  Labs:  Results for orders placed or performed during the hospital encounter of 06/13/16 (from the past 48 hour(s))  Basic metabolic panel     Status: None   Collection Time: 06/15/16  9:30 PM  Result Value  Ref Range   Sodium 138 135 - 145 mmol/L   Potassium 3.5 3.5 - 5.1 mmol/L   Chloride 107 101 - 111 mmol/L   CO2 22 22 - 32 mmol/L   Glucose, Bld 85 65 - 99 mg/dL   BUN 10 6 - 20 mg/dL   Creatinine, Ser 0.66 0.44 - 1.00 mg/dL   Calcium 9.1 8.9 - 10.3 mg/dL   GFR calc non Af Amer >60 >60 mL/min    GFR calc Af Amer >60 >60 mL/min    Comment: (NOTE) The eGFR has been calculated using the CKD EPI equation. This calculation has not been validated in all clinical situations. eGFR's persistently <60 mL/min signify possible Chronic Kidney Disease.    Anion gap 9 5 - 15  CBC with Differential     Status: None   Collection Time: 06/15/16  9:30 PM  Result Value Ref Range   WBC 5.5 4.0 - 10.5 K/uL   RBC 4.88 3.87 - 5.11 MIL/uL   Hemoglobin 14.9 12.0 - 15.0 g/dL   HCT 42.7 36.0 - 46.0 %   MCV 87.5 78.0 - 100.0 fL   MCH 30.5 26.0 - 34.0 pg   MCHC 34.9 30.0 - 36.0 g/dL   RDW 12.1 11.5 - 15.5 %   Platelets 188 150 - 400 K/uL   Neutrophils Relative % 57 %   Neutro Abs 3.2 1.7 - 7.7 K/uL   Lymphocytes Relative 32 %   Lymphs Abs 1.8 0.7 - 4.0 K/uL   Monocytes Relative 9 %   Monocytes Absolute 0.5 0.1 - 1.0 K/uL   Eosinophils Relative 1 %   Eosinophils Absolute 0.1 0.0 - 0.7 K/uL   Basophils Relative 1 %   Basophils Absolute 0.0 0.0 - 0.1 K/uL  Urinalysis, Routine w reflex microscopic     Status: Abnormal   Collection Time: 06/15/16 10:04 PM  Result Value Ref Range   Color, Urine STRAW (A) YELLOW   APPearance CLEAR CLEAR   Specific Gravity, Urine 1.005 1.005 - 1.030   pH 7.0 5.0 - 8.0   Glucose, UA NEGATIVE NEGATIVE mg/dL   Hgb urine dipstick NEGATIVE NEGATIVE   Bilirubin Urine NEGATIVE NEGATIVE   Ketones, ur 5 (A) NEGATIVE mg/dL   Protein, ur NEGATIVE NEGATIVE mg/dL   Nitrite NEGATIVE NEGATIVE   Leukocytes, UA NEGATIVE NEGATIVE    Current Facility-Administered Medications  Medication Dose Route Frequency Provider Last Rate Last Dose  . citalopram (CELEXA) tablet 10 mg  10 mg Oral Daily Naveed Humphres, MD      . dextrose 5 %-0.45 % sodium chloride infusion   Intravenous Continuous Sherwood Gambler, MD 75 mL/hr at 06/16/16 0859    . LORazepam (ATIVAN) injection 1 mg  1 mg Intravenous Q2H Patrecia Pour, NP   1 mg at 06/16/16 1045  . LORazepam (ATIVAN) tablet 1 mg  1 mg Oral  Q6H PRN Robinson Brinkley, MD      . risperiDONE (RISPERDAL) tablet 0.5 mg  0.5 mg Oral BID Patrecia Pour, NP       No current outpatient prescriptions on file.    Musculoskeletal: Strength & Muscle Tone: within normal limits Gait & Station: normal Patient leans: N/A  Psychiatric Specialty Exam: Physical Exam  Constitutional: She is oriented to person, place, and time. She appears well-developed and well-nourished.  HENT:  Head: Normocephalic.  Neck: Normal range of motion.  Respiratory: Effort normal.  Musculoskeletal: Normal range of motion.  Neurological: She is alert and oriented to person, place,  and time.  Psychiatric: Judgment normal. Her affect is blunt. Her speech is delayed. She is slowed and withdrawn. Cognition and memory are impaired. She exhibits a depressed mood. She expresses suicidal ideation. She expresses suicidal plans.    Review of Systems  Psychiatric/Behavioral: Positive for depression, hallucinations and suicidal ideas.  All other systems reviewed and are negative.   Blood pressure 112/66, pulse 75, temperature 98.9 F (37.2 C), temperature source Oral, resp. rate 18, SpO2 100 %.There is no height or weight on file to calculate BMI.  General Appearance: Casual  Eye Contact:  Fair  Speech:  Slow  Volume:  Decreased  Mood:  Depressed  Affect:  Depressed and Flat  Thought Process:  Irrelevant  Orientation:  Other:  person  Thought Content:  Delusions and Hallucinations: Auditory  Suicidal Thoughts:  Yes.  with intent/plan  Homicidal Thoughts:  No  Memory:  Immediate;   Fair Recent;   Fair Remote;   Fair  Judgement:  Impaired  Insight:  Lacking  Psychomotor Activity:  Decreased  Concentration:  Concentration: Fair and Attention Span: Fair  Recall:  [employer] of Knowledge:  Fair  Language:  Good  Akathisia:  No  Handed:  Right  AIMS (if indicated):     Assets:  Housing Leisure Time Physical Health Resilience Social Support  ADL's:  Intact   Cognition:  Impaired,  Moderate  Sleep:        Treatment Plan Summary: Daily contact with patient to assess and evaluate symptoms and progress in treatment, Medication management and Plan major depressive disorder, single episode, severe with psychosis:  -Crisis stabilization -Medication management:  Started Celexa 10 mg daily for depression, Risperdal 0.5 mg BID for psychosis, and Ativan 1 mg every two hours (4 doses) for catatonia -Individual counseling  Disposition: Recommend psychiatric Inpatient admission when medically cleared.  Waylan Boga, NP 06/16/2016 11:08 AM  Patient seen face-to-face for psychiatric evaluation, chart reviewed and case discussed with the physician extender and developed treatment plan. Reviewed the information documented and agree with the treatment plan. Corena Pilgrim, MD

## 2016-06-16 NOTE — ED Notes (Signed)
Pt lying on back with eyes semi-closed.  Sternal rub given without response from pt.  Pt's rise and fall of chest noted.  IVF continues infusing via pump.

## 2016-06-16 NOTE — ED Notes (Signed)
Pt's boyfriend in with pt.

## 2016-06-16 NOTE — ED Notes (Signed)
Pt eating and drinking on her own.  She states "when can I go home?  If I eat and drink, can I go home?"

## 2016-06-16 NOTE — ED Notes (Signed)
Boyfriend at bedside.  Pt calm.

## 2016-06-16 NOTE — ED Notes (Signed)
Pt encouraged to eat/drink.  Food/drink offered; pt refused.

## 2016-06-16 NOTE — ED Notes (Signed)
Pt appropriate with ED MD.  When asked to stand up, she complied.  RN offered nutrition and pt stated "yes, im thirsty".  Toileting offered.  Pt walked to bathroom.

## 2016-06-17 ENCOUNTER — Inpatient Hospital Stay (HOSPITAL_COMMUNITY)
Admission: AD | Admit: 2016-06-17 | Discharge: 2016-06-26 | DRG: 885 | Disposition: A | Discharge status: Home / Self Care | Payer: No Typology Code available for payment source | Attending: Psychiatry | Admitting: Psychiatry

## 2016-06-17 ENCOUNTER — Encounter (HOSPITAL_COMMUNITY): Payer: Self-pay | Admitting: *Deleted

## 2016-06-17 DIAGNOSIS — F333 Major depressive disorder, recurrent, severe with psychotic symptoms: Secondary | ICD-10-CM | POA: Diagnosis present

## 2016-06-17 DIAGNOSIS — R45851 Suicidal ideations: Secondary | ICD-10-CM | POA: Diagnosis not present

## 2016-06-17 DIAGNOSIS — F419 Anxiety disorder, unspecified: Secondary | ICD-10-CM | POA: Diagnosis present

## 2016-06-17 DIAGNOSIS — G4700 Insomnia, unspecified: Secondary | ICD-10-CM | POA: Diagnosis present

## 2016-06-17 DIAGNOSIS — Z79899 Other long term (current) drug therapy: Secondary | ICD-10-CM

## 2016-06-17 DIAGNOSIS — F323 Major depressive disorder, single episode, severe with psychotic features: Secondary | ICD-10-CM | POA: Diagnosis present

## 2016-06-17 DIAGNOSIS — F2081 Schizophreniform disorder: Secondary | ICD-10-CM

## 2016-06-17 MED ORDER — ACETAMINOPHEN 325 MG PO TABS: 650.0000 mg | ORAL_TABLET | Freq: Four times a day (QID) | ORAL | Status: DC | PRN

## 2016-06-17 MED ORDER — ALUM & MAG HYDROXIDE-SIMETH 200-200-20 MG/5ML PO SUSP: 30.0000 mL | ORAL | Status: DC | PRN

## 2016-06-17 MED ORDER — MAGNESIUM HYDROXIDE 400 MG/5ML PO SUSP: 30.0000 mL | Freq: Every day | ORAL | Status: DC | PRN

## 2016-06-17 MED ORDER — LORAZEPAM 1 MG PO TABS: 1.0000 mg | ORAL_TABLET | Freq: Four times a day (QID) | ORAL | Status: DC | PRN

## 2016-06-17 MED ORDER — RISPERIDONE 0.5 MG PO TABS
0.5000 mg | ORAL_TABLET | Freq: Two times a day (BID) | ORAL | Status: DC
  Administered 2016-06-18 (×2): 0.5 mg via ORAL
  Filled 2016-06-17 (×7): qty 1

## 2016-06-17 MED ORDER — CITALOPRAM HYDROBROMIDE 10 MG PO TABS
10.0000 mg | ORAL_TABLET | Freq: Every day | ORAL | Status: DC
  Administered 2016-06-17 – 2016-06-20 (×4): 10 mg via ORAL
  Filled 2016-06-17 (×6): qty 1

## 2016-06-17 MED ORDER — TRAZODONE HCL 50 MG PO TABS
50.0000 mg | ORAL_TABLET | Freq: Every evening | ORAL | Status: DC | PRN
  Administered 2016-06-22: 50 mg via ORAL
  Filled 2016-06-17: qty 1
  Filled 2016-06-17: qty 7

## 2016-06-17 NOTE — Progress Notes (Signed)
  DATA ACTION RESPONSE  Objective- Pt. is visible in the room, seen resting in bed with eyes open. Presents with a flat affect and mood with an intense stare. Appears paranoid/suspicious of staff.  No further c/o. No abnormal s/s.  Subjective- Denies having any SI/HI/AVH/Pain at this time. Pt. states "No, I am good". Remains safe on the unit.  1:1 interaction in private to establish rapport. Encouragement, education, & support given from staff.    Safety maintained with Q 15 checks. Continue with POC.

## 2016-06-17 NOTE — BH Assessment (Addendum)
Dr. Jannifer FranklinAkintayo and Hillery Jacksanika Lewis, NP inpatient treatment is recommended. [facility] Patient is under IVC. Pending [employer] transport.

## 2016-06-17 NOTE — ED Notes (Signed)
Patient is very pleasant today. Engaging in conversations and talking about getting better so she can get out of here and see the sunshine in a few days. She agreed and willingly took medications this morning. Also ate 100% of breakfast and request additional to drink.

## 2016-06-17 NOTE — Progress Notes (Signed)
Admission Note:  [age] year female who presents IVC, in no acute distress, for self-neglect. Patient appears flat and depressed.  Patient was calm and cooperative with admission process. Patient reports that she is being admitted because "my mom and sister are concerned that I wasn't eating enough and wasn't communicating enough".  Patient reports self-neglect was triggered by an event. Patient refuses to discuss the event and states that she will only discuss event with a psychiatrist.  Patient denies SI and contracts for safety upon admission. Patient denies AVH. Patient denies any significant health hx.  Patient is unable to identify a support system. Patient states "I don't ever want to be a burden by talking to my boyfriend and family about my problems".  Patient currently lives with her mother.  [facility], patient would like to "go home", "have a clear mind of how to walk through life", "Be more confident in myself", and "Not worry about what my past was".  Skin was assessed and found to be clear of any abnormal marks.  Patient searched and no contraband found, POC and unit policies explained and understanding verbalized. Consents obtained.  Patient had no additional questions or concerns.

## 2016-06-17 NOTE — ED Notes (Signed)
Pt compliant and calm. Pt [facility]. She is confirmed understanding with teachback method. She continues to ask when she can be signed out to go home but it pleasant in all interactions.

## 2016-06-17 NOTE — Tx Team (Signed)
Initial Treatment Plan 06/17/2016 4:20 PM Alla GermanDaniela Prowell AVW:[id number]    PATIENT STRESSORS: Financial difficulties Traumatic event   PATIENT STRENGTHS: Ability for insight Average or above average intelligence Communication skills   PATIENT IDENTIFIED PROBLEMS: Ineffective Coping  "Go Home"  "Have a clear mind of how to walk through life"  "Be more confident in myself"  "not worry about what my past was"             DISCHARGE CRITERIA:  Ability to meet basic life and health needs Improved stabilization in mood, thinking, and/or behavior Medical problems require only outpatient monitoring Motivation to continue treatment in a less acute level of care Need for constant or close observation no longer present  PRELIMINARY DISCHARGE PLAN: Outpatient therapy Return to previous living arrangement Return to previous work or school arrangements  PATIENT/FAMILY INVOLVEMENT: This treatment plan has been presented to and reviewed with the patient, Alla GermanDaniela Mcinroy.  The patient and family have been given the opportunity to ask questions and make suggestions.  Carleene OverlieMiddleton, Daryl Quiros P, RN 06/17/2016, 4:20 PM

## 2016-06-17 NOTE — Consult Note (Signed)
[facility] Consult   Reason for Consult:  Depression with suicide attempt Referring Physician:  EDP Patient Identification: Deborah Harris MRN:  [id number] Principal Diagnosis: Major depressive disorder with psychotic features [facility]) Diagnosis:   Patient Active Problem List   Diagnosis Date Noted  . Major depressive disorder with psychotic features ([place]) [F32.3] 06/14/2016    Priority: Medium  . Acne [L70.9] 05/21/2012  . Ovarian cyst, right [N83.201] 02/27/2012    Total Time spent with patient: 30 minutes  Subjective:   Deborah Harris is a 21 y.o. female patient admitted with depression, suicide attempt, catatonia  HPI:  Patient was interviewed today, chart reviewed and case discussed with treatment team. Patient spoke with the team today, she reports been depressed and dealing with her minds playing tricks on her. She reports recurrent negative thoughts and hearing a voice inside her telling her she is not good and don't deserve to live. Patient often stares in the space, slow to respond and says her  behaviors are a result of her confessing her sin to God and her boyfriend.   Past Psychiatric History: none  Risk to Self: Suicidal Ideation: No Suicidal Intent: No Is patient at risk for suicide?: Yes Suicidal Plan?: No Access to Means: No What has been your use of drugs/alcohol within the last 12 months?: Unknown.  UDS and BAL clear How many times?:  (Unknown really.) Other Self Harm Risks: Not eating. Triggers for Past Attempts: Unknown Intentional Self Injurious Behavior: Damaging (Pt has not been eating, sleeping or taking care of herself.) Comment - Self Injurious Behavior: Unknown Risk to Others: Homicidal Ideation: No Thoughts of Harm to Others: No Current Homicidal Intent: No Current Homicidal Plan: No Access to Homicidal Means: No Identified Victim: No one History of harm to others?: Yes Assessment of Violence: On admission Violent Behavior  Description: Spitting and pushing people trying to help her. Does patient have access to weapons?: No Criminal Charges Pending?: No Does patient have a court date: No Prior Inpatient Therapy: Prior Inpatient Therapy:  (Unknown) Prior Therapy Dates: Unknown Prior Therapy Facilty/Provider(s): Unknown Reason for Treatment: Unknown Prior Outpatient Therapy: Prior Outpatient Therapy:  (Unknown) Prior Therapy Dates: Unknown Prior Therapy Facilty/Provider(s): Unknown Reason for Treatment: Unknown Does patient have an ACCT team?: Unknown Does patient have Intensive In-House Services?  : Unknown Does patient have [facility] services? : Unknown Does patient have P4CC services?: Unknown  Past Medical History: History reviewed. No pertinent past medical history. History reviewed. No pertinent surgical history. Family History:  Family History  Problem Relation Age of Onset  . Diabetes Maternal Grandmother    Family Psychiatric  History: unknown Social History:  History  Alcohol Use No     History  Drug Use No    Social History   Social History  . Marital status: Single    Spouse name: N/A  . Number of children: N/A  . Years of education: N/A   Social History Main Topics  . Smoking status: Never Smoker  . Smokeless tobacco: Never Used  . Alcohol use No  . Drug use: No  . Sexual activity: Yes   Other Topics Concern  . None   Social History Narrative  . None   Additional Social History:    Allergies:  No Known Allergies  Labs:  Results for orders placed or performed during the hospital encounter of 06/13/16 (from the past 48 hour(s))  Basic metabolic panel     Status: None   Collection Time: 06/15/16  9:30  PM  Result Value Ref Range   Sodium 138 135 - 145 mmol/L   Potassium 3.5 3.5 - 5.1 mmol/L   Chloride 107 101 - 111 mmol/L   CO2 22 22 - 32 mmol/L   Glucose, Bld 85 65 - 99 mg/dL   BUN 10 6 - 20 mg/dL   Creatinine, Ser 0.66 0.44 - 1.00 mg/dL   Calcium 9.1 8.9 - 10.3  mg/dL   GFR calc non Af Amer >60 >60 mL/min   GFR calc Af Amer >60 >60 mL/min    Comment: (NOTE) The eGFR has been calculated using the CKD EPI equation. This calculation has not been validated in all clinical situations. eGFR's persistently <60 mL/min signify possible Chronic Kidney Disease.    Anion gap 9 5 - 15  CBC with Differential     Status: None   Collection Time: 06/15/16  9:30 PM  Result Value Ref Range   WBC 5.5 4.0 - 10.5 K/uL   RBC 4.88 3.87 - 5.11 MIL/uL   Hemoglobin 14.9 12.0 - 15.0 g/dL   HCT 42.7 36.0 - 46.0 %   MCV 87.5 78.0 - 100.0 fL   MCH 30.5 26.0 - 34.0 pg   MCHC 34.9 30.0 - 36.0 g/dL   RDW 12.1 11.5 - 15.5 %   Platelets 188 150 - 400 K/uL   Neutrophils Relative % 57 %   Neutro Abs 3.2 1.7 - 7.7 K/uL   Lymphocytes Relative 32 %   Lymphs Abs 1.8 0.7 - 4.0 K/uL   Monocytes Relative 9 %   Monocytes Absolute 0.5 0.1 - 1.0 K/uL   Eosinophils Relative 1 %   Eosinophils Absolute 0.1 0.0 - 0.7 K/uL   Basophils Relative 1 %   Basophils Absolute 0.0 0.0 - 0.1 K/uL  Urinalysis, Routine w reflex microscopic     Status: Abnormal   Collection Time: 06/15/16 10:04 PM  Result Value Ref Range   Color, Urine STRAW (A) YELLOW   APPearance CLEAR CLEAR   Specific Gravity, Urine 1.005 1.005 - 1.030   pH 7.0 5.0 - 8.0   Glucose, UA NEGATIVE NEGATIVE mg/dL   Hgb urine dipstick NEGATIVE NEGATIVE   Bilirubin Urine NEGATIVE NEGATIVE   Ketones, ur 5 (A) NEGATIVE mg/dL   Protein, ur NEGATIVE NEGATIVE mg/dL   Nitrite NEGATIVE NEGATIVE   Leukocytes, UA NEGATIVE NEGATIVE    Current Facility-Administered Medications  Medication Dose Route Frequency Provider Last Rate Last Dose  . citalopram (CELEXA) tablet 10 mg  10 mg Oral Daily Dakiya Puopolo, MD   10 mg at 06/17/16 1011  . dextrose 5 %-0.45 % sodium chloride infusion   Intravenous Continuous Sherwood Gambler, MD 75 mL/hr at 06/17/16 0233 1,000 mL at 06/17/16 0233  . LORazepam (ATIVAN) tablet 1 mg  1 mg Oral Q6H PRN  Niti Leisure, MD      . risperiDONE (RISPERDAL) tablet 0.5 mg  0.5 mg Oral BID Patrecia Pour, NP   0.5 mg at 06/17/16 1011   No current outpatient prescriptions on file.    Musculoskeletal: Strength & Muscle Tone: within normal limits Gait & Station: normal Patient leans: N/A  Psychiatric Specialty Exam: Physical Exam  Constitutional: She is oriented to person, place, and time. She appears well-developed and well-nourished.  HENT:  Head: Normocephalic.  Neck: Normal range of motion.  Respiratory: Effort normal.  Musculoskeletal: Normal range of motion.  Neurological: She is alert and oriented to person, place, and time.  Psychiatric: Judgment normal. Her affect is blunt.  Her speech is delayed. She is slowed, withdrawn and actively hallucinating. Cognition and memory are impaired. She exhibits a depressed mood. She expresses suicidal ideation. She expresses suicidal plans.    Review of Systems  Psychiatric/Behavioral: Positive for depression, hallucinations and suicidal ideas.  All other systems reviewed and are negative.   Blood pressure 111/63, pulse 76, temperature 98.1 F (36.7 C), temperature source Oral, resp. rate 18, SpO2 99 %.There is no height or weight on file to calculate BMI.  General Appearance: Casual  Eye Contact:  Fair  Speech:  Slow  Volume:  Decreased  Mood:  Depressed  Affect:  Depressed and Flat  Thought Process:  Irrelevant  Orientation:  Other:  person  Thought Content:  Delusions and Hallucinations: Auditory  Suicidal Thoughts:  Yes.  with intent/plan  Homicidal Thoughts:  No  Memory:  Immediate;   Fair Recent;   Fair Remote;   Fair  Judgement:  Impaired  Insight:  Lacking  Psychomotor Activity:  Decreased  Concentration:  Concentration: Fair and Attention Span: Fair  Recall:  [employer] of Knowledge:  Fair  Language:  Good  Akathisia:  No  Handed:  Right  AIMS (if indicated):     Assets:  Housing Leisure Time Physical  Health Resilience Social Support  ADL's:  Intact  Cognition:  Impaired,  Moderate  Sleep:        Treatment Plan Summary: Daily contact with patient to assess and evaluate symptoms and progress in treatment, Medication management and Plan major depressive disorder, single episode, severe with psychosis:  -Crisis stabilization -Medication management: -Continue Celexa 10 mg daily for depression, Risperdal 0.5 mg BID for psychosis, and Ativan 1 mg every two hours (4 doses) for catatonia -Individual counseling  Disposition: Recommend psychiatric Inpatient admission when medically cleared.  Corena Pilgrim, MD 06/17/2016 11:10 AM

## 2016-06-17 NOTE — Plan of Care (Signed)
Problem: Safety: Goal: Periods of time without injury will increase Outcome: Progressing Pt. remains a low fall risk, denies SI/HI/AVH at this time, Q 15 checks in effect.    

## 2016-06-17 NOTE — Progress Notes (Signed)
Patient ID: Deborah GermanDaniela Harris, female   DOB: 05/21/1995, 21 y.o.   MRN: [id number] D  ---  Pt agrees to contract for safety and denies pain.  She was anxious and worried when she first came onto the unit.  Nurse allowed pt to be in her room alone , long enough to become accustom to her surrounds before talking with her.   She is calm, friendly and asking when the Dr. will come to see her.  She has good eye contact and appears to have good insight.  Pt said she has plans to return to college and become a Engineer, civil (consulting)urse.  Pt appears to be vested in treatment AEB pt asking "  will staff  keep me informed about what is going to happen next ?  I don't want to miss out on anything important while I am here. "  --- A --- meet pt  and provide [facility] and answer pts questions or concerns --- R ---   Pt appeared more relaxed at end of conversation

## 2016-06-17 NOTE — Progress Notes (Signed)
Pt attend group. Her goal was to be more welcome and trusting

## 2016-06-18 DIAGNOSIS — F2081 Schizophreniform disorder: Secondary | ICD-10-CM

## 2016-06-18 DIAGNOSIS — G4700 Insomnia, unspecified: Secondary | ICD-10-CM

## 2016-06-18 MED ORDER — RISPERIDONE 1 MG PO TABS
1.0000 mg | ORAL_TABLET | Freq: Every day | ORAL | Status: DC
  Administered 2016-06-19: 1 mg via ORAL
  Filled 2016-06-18 (×2): qty 1

## 2016-06-18 NOTE — Progress Notes (Signed)
D:  Patient's self inventory sheet, patient has fair sleep, no sleep medication given.  Good appetite, normal energy level, good concentration.  Rated depression 3, hopeless and anxiety 2.  Denied withdrawals.  Denied SI.   Denied physical problems.  Denied physical pain.  Worst pain in past 24 hours is #4, L side of upper hip.  No pain medication.  Goal is "staying positive".   Plans to call family and stay positive, smile.  Will discuss medications with MD to see if that is one reason she feels moody. A:  Medications administered per MD orders.  Emotional support and encouragement given patient. A:  Patient denied SI and HI, contracts for safety.  Denied A/V hallucinations.  Safety maintained with 15 minute checks.

## 2016-06-18 NOTE — Plan of Care (Signed)
Problem: Coping: Goal: Ability to identify and develop effective coping behavior will improve Outcome: Progressing Nurse discussed depression/anxiety/coping skills with patient.    

## 2016-06-18 NOTE — Progress Notes (Signed)
EKG completed and placed on patient's chart 

## 2016-06-18 NOTE — Progress Notes (Signed)
Psychoeducational Group Note  Date:  06/18/2016 Time:  2058  Group Topic/Focus:  Wrap-Up Group:   The focus of this group is to help patients review their daily goal of treatment and discuss progress on daily workbooks.  Participation Level: Did Not Attend  Participation Quality:  Not Applicable  Affect:  Not Applicable  Cognitive:  Not Applicable  Insight:  Not Applicable  Engagement in Group: Not Applicable  Additional Comments:  The patient did not attend group this evening.   Hazle CocaGOODMAN, Draven Natter S 06/18/2016, 8:58 PM

## 2016-06-18 NOTE — [facility] Group Notes (Signed)
[facility] LCSW Group Therapy  06/18/2016   Type of Therapy:  Group Therapy  Participation Level:  Active  Participation Quality:  Appropriate and Attentive  Affect:  Appropriate  Cognitive:  Alert and Oriented  Insight:  Improving  Engagement in Therapy:  Improving  Modes of Intervention:  Discussion  Today's group was about using different tools to prepare for successful discharge. Facilitator identified three major areas to review. One was supports at discharge. Another area was utilizing coping skills to manage mood and behavior at discharge. Finally identifying goals in order to have clear directive for ongoing progress. Patients were able to engage will and identify how to develop these key tools for a good discharge. Patient had very flat affect and spoke in vague terms. Patient was able to identify that being happy was a key goal for her support post discharge but had difficultly identifying measurable goals to detail a plan.  Beverly Sessionsywan J Dayna Alia MSW, LCSW
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
## 2016-06-18 NOTE — [facility] Suicide Risk Assessment (Signed)
[facility] Admission Suicide Risk Assessment   Nursing information obtained from:  Patient Demographic factors:  Adolescent or young adult Current Mental Status:  Self-harm behaviors Loss Factors:  Financial problems / change in socioeconomic status Historical Factors:  Family history of mental illness or substance abuse Risk Reduction Factors:  Employed, Living with another person, especially a relative  Total Time spent with patient: 45 minutes Principal Problem: Acute psychosis Diagnosis:   Patient Active Problem List   Diagnosis Date Noted  . 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 Major depressive disorder with psychotic features (HCC) [F32.3] 06/14/2016  . Acne [L70.9] 05/21/2012  . Ovarian cyst, right [N83.201] 02/27/2012   Subjective Data:  21 yo Caucasian female, single, lives with her family. Involuntarily committed by her family. Reported disorganized behavior. Has been internally stimulated. Not able to take care of herself. Has not been able to function independently. No substance use. Reported to stare in a reptilian fashion. Was started on Risperidone and had a dose already. At interview, seems more spontaneous than described. Reports difficulty communicating with people. Says she is stuck in her head with lots of ambivalence about what is right. Difficult to tease this out. Denies hallucinations. Denies interference from external forces. Denies any suicidal thoughts. Denies homicidal thoughts. Denies any thought of violence. No acces to weapons.  Says she feels sedated from Risperidone. I discussed use only at night time. Would revisit if not tolerated and consider Abilify.  Continued Clinical Symptoms:  Alcohol Use Disorder Identification Test Final Score (AUDIT): 0 The "Alcohol Use Disorders Identification Test", Guidelines  for Use in Primary Care, Second Edition.  World Science writer [employer]). Score between 0-7:  no or low risk or alcohol related problems. Score between 8-15:  moderate risk of alcohol related problems. Score between 16-19:  high risk of alcohol related problems. Score 20 or above:  warrants further diagnostic evaluation for alcohol dependence and treatment.   CLINICAL FACTORS:   Psychosis   Musculoskeletal: Strength & Muscle Tone: within normal limits Gait & Station: normal Patient leans: N/A  Psychiatric Specialty Exam: Physical Exam  Constitutional: No distress.  HENT:  Head: Normocephalic.  Eyes: Pupils are equal, round, and reactive to light.  Neck: Normal range of motion. Neck supple.  Cardiovascular: Normal rate.   Respiratory: Effort normal.  Musculoskeletal: Normal range of motion.  Neurological: She is alert.  Skin: Skin is warm and dry.  Psychiatric:  As above    ROS  Blood pressure 116/66, pulse 84, temperature 98.5 F (36.9 C), temperature source Oral, resp. rate 16, height 5' 5.5" (1.664 m), weight 42.9 kg (94 lb 9.6 oz).Body mass index is 15.5 kg/m.  General Appearance: Bizarre and poor grooming, tearful.   Eye Contact:  Moderate  Speech:  Clear and Coherent  Volume:  Normal  Mood:  Overwhelmed  Affect:  Odd and has a psychotic quality  Thought Process:  Goes of tangent at times  Orientation:  Full (Time, Place, and Person)  Thought Content:  Poverty of thought  Suicidal Thoughts:  No  Homicidal Thoughts:  No  Memory:  Unable to assess at this time.   Judgement:  Poor  Insight:  Shallow  Psychomotor Activity:  Decreased  Concentration:  Distractible   Recall:  NA  Fund of Knowledge:  Fair  Language:  Good  Akathisia:  Negative  Handed:    AIMS (if  indicated):     Assets:  Health and safety inspectorinancial Resources/Insurance Housing Physical Health  ADL's:  Impaired  Cognition:  WNL  Sleep:  Number of Hours: 6.75      COGNITIVE FEATURES THAT CONTRIBUTE TO  RISK:  Loss of executive function    SUICIDE RISK:   Mild:  Suicidal ideation of limited frequency, intensity, duration, and specificity.  There are no identifiable plans, no associated intent, mild dysphoria and related symptoms, good self-control (both objective and subjective assessment), few other risk factors, and identifiable protective factors, including available and accessible social support.  PLAN OF CARE:  1. Suicide precautions 2. Continue Risperidone 1 mg HS.     I certify that inpatient services furnished can reasonably be expected to improve the patient's condition.   Georgiann CockerVincent A Izediuno, MD 06/18/2016, 5:00 PM

## 2016-06-18 NOTE — H&P (Signed)
Psychiatric Admission Assessment Adult  Patient Identification: Deborah Harris MRN:  [id number] Date of Evaluation:  06/18/2016 Chief Complaint:  MDD WITH PSYCHOTIC FEATURES Principal Diagnosis: Schizophreniform disorder (HCC) Diagnosis:   Patient Active Problem List   Diagnosis Date Noted  . 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 Major depressive disorder with psychotic features (HCC) [F32.3] 06/14/2016  . Acne [L70.9] 05/21/2012  . Ovarian cyst, right [N83.201] 02/27/2012   History of Present Illness: per consult notes:21 yo female who presented to the ED with depression and agitation, initially.  She evidently got agitated at home and they brought her to the ED.  Here she goes in and out of catatonia, minimal eating and drinking.  Stares often in space, slow to respond.  No homicidal ideations or alcohol/drug abuse.  Believes her behaviors are a result of her confessing her sin to God and her boyfriend.  Inpatient psychiatric care needed. Associated Signs/Symptoms: Depression Symptoms:  depressed mood, psychomotor agitation, difficulty concentrating, (Hypo) Manic Symptoms:  Delusions, Flight of Ideas, Anxiety Symptoms:  Excessive Worry, Social Anxiety, Psychotic Symptoms:  Delusions, Paranoia, PTSD Symptoms: NA Total Time spent with patient: 20 minutes  Past Psychiatric History:   Is the patient at risk to self? Yes.    Has the patient been a risk to self in the past 6 months? Yes.    Has the patient been a risk to self within the distant past? Yes.    Is the patient a risk to others? Yes.    Has the patient been a risk to others in the past 6 months? Yes.    Has the patient been a risk to others within the distant past? Yes.     Prior Inpatient Therapy:   Prior  Outpatient Therapy:    Alcohol Screening: 1. How often do you have a drink containing alcohol?: Never 9. Have you or someone else been injured as a result of your drinking?: No 10. Has a relative or friend or a doctor or another health worker been concerned about your drinking or suggested you cut down?: No Alcohol Use Disorder Identification Test Final Score (AUDIT): 0 Brief Intervention: AUDIT score less than 7 or less-screening does not suggest unhealthy drinking-brief intervention not indicated Substance Abuse History in the last 12 months:  No. Consequences of Substance Abuse: NA Previous Psychotropic Medications: YES Psychological Evaluations: YES Past Medical History: History reviewed. No pertinent past medical history. History reviewed. No pertinent surgical history. Family History:  Family History  Problem Relation Age of Onset  . Diabetes Maternal Grandmother    Family Psychiatric  History:  Tobacco Screening: Have you used any form of tobacco in the last 30 days? (Cigarettes, Smokeless Tobacco, Cigars, and/or Pipes): No Social History:  History  Alcohol Use No     History  Drug Use No    Additional Social History: Marital status: Single Has your sexual activity been affected by drugs, alcohol, medication, or emotional stress?: No Does patient have children?: No                         Allergies:  No Known Allergies Lab Results: No results found for this or any previous visit (from the past 48 hour(s)).  Blood Alcohol level:  Lab Results  Component Value Date   [facility] <5 06/13/2016  Metabolic Disorder Labs:  No results found for: HGBA1C, MPG No results found for: PROLACTIN No results found for: CHOL, TRIG, HDL, CHOLHDL, VLDL, LDLCALC  Current Medications: Current Facility-Administered Medications  Medication Dose Route Frequency Provider Last Rate Last Dose  . acetaminophen (TYLENOL) tablet 650 mg  650 mg Oral Q6H PRN Oneta Rack, NP      .  alum & mag hydroxide-simeth (MAALOX/MYLANTA) 200-200-20 MG/5ML suspension 30 mL  30 mL Oral Q4H PRN Oneta Rack, NP      . citalopram (CELEXA) tablet 10 mg  10 mg Oral Daily Charm Rings, NP   10 mg at 06/18/16 0906  . LORazepam (ATIVAN) tablet 1 mg  1 mg Oral Q6H PRN Charm Rings, NP      . magnesium hydroxide (MILK OF MAGNESIA) suspension 30 mL  30 mL Oral Daily PRN Oneta Rack, NP      . Melene Muller ON 06/19/2016] risperiDONE (RISPERDAL) tablet 1 mg  1 mg Oral QHS Izediuno, Vincent A, MD      . traZODone (DESYREL) tablet 50 mg  50 mg Oral QHS PRN Oneta Rack, NP       PTA Medications: No prescriptions prior to admission.    Musculoskeletal: Strength & Muscle Tone: within normal limits Gait & Station: normal Patient leans: N/A  Psychiatric Specialty Exam: Physical Exam  Nursing note and vitals reviewed. Cardiovascular: Normal rate.   Neurological: She is alert.  Psychiatric: She has a normal mood and affect.    Review of Systems  Psychiatric/Behavioral: Positive for depression.    Blood pressure 116/66, pulse 84, temperature 98.5 F (36.9 C), temperature source Oral, resp. rate 16, height 5' 5.5" (1.664 m), weight 42.9 kg (94 lb 9.6 oz).Body mass index is 15.5 kg/m.  General Appearance: Disheveled, patient continues to present with a slow, blunted  affect  Eye Contact:  Fair  Speech:  Clear and Coherent mild thought blocked noted  Volume:  Normal  Mood:  Dysphoric  Affect:  Blunt, Constricted and Flat  Thought Process:  Coherent  Orientation:  Other:  person and place  Thought Content:  Hallucinations: Auditory  Suicidal Thoughts:  No  Homicidal Thoughts:  No  Memory:  Immediate;   Poor Recent;   Poor Remote;   Poor  Judgement:  Impaired  Insight:  Lacking  Psychomotor Activity:  Restlessness  Concentration:  Concentration: Fair  Recall:  [employer] of Knowledge:  Fair  Language:  Fair  Akathisia:  No  Handed:  Right  AIMS (if indicated):     Assets:   Communication Skills Desire for Improvement Resilience Social Support  ADL's:  Intact  Cognition:  WNL  Sleep:  Number of Hours: 6.75     I agree with current treatment plan on 06/18/2016, Patient seen face-to-face for psychiatric evaluation follow-up, chart reviewed and case discussed with the MD Izediuno. Reviewed the information documented and agree with the treatment plan.  Treatment Plan Summary: Daily contact with patient to assess and evaluate symptoms and progress in treatment and Medication management   Continue with Celexa 10mg , Risperdal 1mg   Continue with Trazodone 50 mg for insomnia Will continue to monitor vitals ,medication compliance and treatment side effects while patient is here.  Reviewed labs,BAL - , UDS - . CSW will start working on disposition.  Patient to participate in therapeutic milieu  Observation Level/Precautions:  15 minute checks  Laboratory:  CBC Chemistry Profile HbAIC UDS UA  Psychotherapy:  individual and group session  Medications:  See above  Consultations:  Psychiatry  Discharge Concerns: safety, stabilization, and risk of access to medication and medication stabilization   Estimated LOS:5-7  Other:     Physician Treatment Plan for Primary Diagnosis: Schizophreniform disorder (HCC) Long Term Goal(s): Improvement in symptoms so as ready for discharge  Short Term Goals: Ability to identify changes in lifestyle to reduce recurrence of condition will improve, Ability to verbalize feelings will improve and Ability to identify and develop effective coping behaviors will improve  Physician Treatment Plan for Secondary Diagnosis: Principal Problem:   Schizophreniform disorder (HCC) Active Problems:   MDD (major depressive disorder), single episode, severe with psychosis (HCC)   Major depressive disorder, recurrent, severe with psychotic features (HCC)  Long Term Goal(s): Improvement in symptoms so as ready for discharge  Short Term  Goals: Ability to identify changes in lifestyle to reduce recurrence of condition will improve, Ability to verbalize feelings will improve, Ability to identify and develop effective coping behaviors will improve and Ability to identify triggers associated with substance abuse/mental health issues will improve  I certify that inpatient services furnished can reasonably be expected to improve the patient's condition.    Oneta Rackanika N Asyria Kolander, NP 6/9/20186:09 PM

## 2016-06-19 DIAGNOSIS — F39 Unspecified mood [affective] disorder: Secondary | ICD-10-CM

## 2016-06-19 DIAGNOSIS — F419 Anxiety disorder, unspecified: Secondary | ICD-10-CM

## 2016-06-19 LAB — LIPID PANEL
Cholesterol: 127 mg/dL (ref 0–200)
HDL: 49 mg/dL (ref >40)
LDL Cholesterol: 71 mg/dL (ref 0–99)
TRIGLYCERIDES: 34 mg/dL (ref <150)
Total CHOL/HDL Ratio: 2.6 RATIO
VLDL: 7 mg/dL (ref 0–40)

## 2016-06-19 LAB — TSH: TSH: 1.503 u[IU]/mL (ref 0.350–4.500)

## 2016-06-19 NOTE — Progress Notes (Signed)
[facility] MD Progress Note  06/19/2016 3:28 PM Rochel Privett  MRN:  [id number]  Subjective: Deborah Harris reports, "I'm doing well. I'm just sad that I'm not at home. I plan on communicating a little better with my mother once I get discharged. I hear my own voice talking to me".  Objective: (Per Md's SRA): 21 yo Caucasian female, single, lives with her family. Involuntarily committed by her family. Reported disorganized behavior. Has been internally stimulated. Not able to take care of herself. Has not been able to function independently. No substance use. Reported to stare in a reptilian fashion. Was started on Risperidone and had a dose already. At interview, seems more spontaneous than described. Reports difficulty communicating with people. Says she is stuck in her head with lots of ambivalence about what is right. Difficult to tease this out.  Deborah Harris is seen, chart reviewed. She is alert, oriented x 3. She is visible on the unit, attending group sessions. She is verbally responsive, presenting with improved affect. She says today that she is doing well, only sad because she is not at her home. She expressed that she will have to learn to communicate a little bit more with her mother once she gets home from the hospital. She denies any new issues. Denies any AVH, but says, she hears her own voice. She denies any SIHI. Taking & tolerating her medication regimen. She does not appear to be responding to any internal stimuli. Presents a very thin appearance.  Principal Problem: Schizophreniform disorder (HCC)  Diagnosis:   Patient Active Problem List   Diagnosis Date Noted  . 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 Major depressive disorder with psychotic features (HCC) [F32.3] 06/14/2016  . Acne  [L70.9] 05/21/2012  . Ovarian cyst, right [N83.201] 02/27/2012   Total Time spent with patient: 25 minutes  Past Psychiatric History: Schizophreniform disorder  Past Medical History: History reviewed. No pertinent past medical history. History reviewed. No pertinent surgical history.  Family History:  Family History  Problem Relation Age of Onset  . Diabetes Maternal Grandmother    Family Psychiatric  History: See H&P  Social History:  History  Alcohol Use No     History  Drug Use No    Social History   Social History  . Marital status: Single    Spouse name: N/A  . Number of children: N/A  . Years of education: N/A   Social History Main Topics  . Smoking status: Never Smoker  . Smokeless tobacco: Never Used  . Alcohol use No  . Drug use: No  . Sexual activity: Yes   Other Topics Concern  . None   Social History Narrative  . None   Additional Social History:   Sleep: Good  Appetite:  Fair  Current Medications: Current Facility-Administered Medications  Medication Dose Route Frequency Provider Last Rate Last Dose  . acetaminophen (TYLENOL) tablet 650 mg  650 mg Oral Q6H PRN Oneta Rack, NP      . alum & mag hydroxide-simeth (MAALOX/MYLANTA) 200-200-20 MG/5ML suspension 30 mL  30 mL Oral Q4H PRN Oneta Rack, NP      . citalopram (CELEXA) tablet 10 mg  10 mg Oral Daily Charm Rings, NP   10 mg at 06/19/16 0841  . LORazepam (ATIVAN) tablet 1 mg  1 mg Oral Q6H PRN Shaune Pollack,  Herminio HeadsJamison Y, NP      . magnesium hydroxide (MILK OF MAGNESIA) suspension 30 mL  30 mL Oral Daily PRN Oneta RackLewis, Tanika N, NP      . risperiDONE (RISPERDAL) tablet 1 mg  1 mg Oral QHS Izediuno, Vincent A, MD      . traZODone (DESYREL) tablet 50 mg  50 mg Oral QHS PRN Oneta RackLewis, Tanika N, NP       Lab Results:  Results for orders placed or performed during the hospital encounter of 06/17/16 (from the past 48 hour(s))  TSH     Status: None   Collection Time: 06/19/16  6:18 AM  Result Value Ref  Range   TSH 1.503 0.350 - 4.500 uIU/mL    Comment: Performed by a 3rd Generation assay with a functional sensitivity of <=0.01 uIU/mL. Performed at [facility], [address] [place], [place] [address]   Lipid panel     Status: None   Collection Time: 06/19/16  6:18 AM  Result Value Ref Range   Cholesterol 127 0 - 200 mg/dL   Triglycerides 34 <604<150 mg/dL   HDL 49 >54>40 mg/dL   Total CHOL/HDL Ratio 2.6 RATIO   VLDL 7 0 - 40 mg/dL   LDL Cholesterol 71 0 - 99 mg/dL    Comment:        Total Cholesterol/HDL:CHD Risk Coronary Heart Disease Risk Table                     Men   Women  1/2 Average Risk   3.4   3.3  Average Risk       5.0   4.4  2 X Average Risk   9.6   7.1  3 X Average Risk  23.4   11.0        Use the calculated Patient Ratio above and the CHD Risk Table to determine the patient's CHD Risk.        ATP III CLASSIFICATION (LDL):  <100     mg/dL   Optimal  098-119100-129  mg/dL   Near or Above                    Optimal  130-159  mg/dL   Borderline  147-829160-189  mg/dL   High  >562>190     mg/dL   Very High Performed at [facility], [address] [place], [place] [address]    Blood Alcohol level:  Lab Results  Component Value Date   ETH <5 06/13/2016   Metabolic Disorder Labs: No results found for: HGBA1C, MPG No results found for: PROLACTIN Lab Results  Component Value Date   CHOL 127 06/19/2016   TRIG 34 06/19/2016   HDL 49 06/19/2016   CHOLHDL 2.6 06/19/2016   VLDL 7 06/19/2016   LDLCALC 71 06/19/2016   Physical Findings: AIMS: Facial and Oral Movements Muscles of Facial Expression: None, normal Lips and Perioral Area: None, normal Jaw: None, normal Tongue: None, normal,Extremity Movements Upper (arms, wrists, hands, fingers): None, normal Lower (legs, knees, ankles, toes): None, normal, Trunk Movements Neck, shoulders, hips: None, normal, Overall Severity Severity of abnormal movements (highest score from questions above): None,  normal Incapacitation due to abnormal movements: None, normal Patient's awareness of abnormal movements (rate only patient's report): No Awareness, Dental Status Current problems with teeth and/or dentures?: No Does patient usually wear dentures?: No  CIWA:  CIWA-Ar Total: 1 COWS:  COWS Total Score: 2  Musculoskeletal:  Strength & Muscle Tone: within normal limits Gait & Station: normal Patient leans: N/A  Psychiatric Specialty Exam: Physical Exam: Nursses notes & Vital signs reviewed.  Review of Systems  Psychiatric/Behavioral: Positive for depression. Negative for substance abuse and suicidal ideas.    Blood pressure (!) 97/48, pulse 90, temperature 98.5 F (36.9 C), temperature source Oral, resp. rate 18, height 5' 5.5" (1.664 m), weight 42.9 kg (94 lb 9.6 oz).Body mass index is 15.5 kg/m.  General Appearance: Guarded, improved affect.   Eye Contact: Good  Speech:  Clear and Coherent  Volume:  Normal  Mood: "Improving"  Affect: Improving  Thought Process: Coherent, goal oriented this time.  Orientation:  Full (Time, Place, and Person)  Thought Content: Denies any hallucintions, delusions or paranoia, however, says, "I hear my own voice"  Suicidal Thoughts:  No, denies any thoughts, plans or intent.  Homicidal Thoughts:  No, denies any thoughts, plans or intent.  Memory: Seem fairly intact.  Judgement: Fair  Insight:  Shallow  Psychomotor Activity:  Decreased  Concentration: Fair   Recall:  NA  Fund of Knowledge:  Fair  Language:  Good  Akathisia:  Negative  Handed:    AIMS (if indicated):     Assets:  Health and safety inspector Housing Physical Health  ADL's:  Impaired  Cognition:  WNL  Sleep:  Number of Hours: 6.75     Treatment Plan Summary: Patient continues to required mood stabilization treatments. No evidence of psychosis shown today. No evidence of mania. No dangerousness. She is visible on the unit, attending groups.    Psychiatric: Schizophreniform  Disorder.  Medical: Will continue monitor for any symptoms & treat on a prn basis.  Psychosocial:  Unemployed  Recent end of his relationship. Will encourage group counseling attendance & participation.  PLAN: 1.06-19-16: No changes made on the current plan of care, continue current regimen as recommended.  Depression:  Continue Citalopram 10 mg daily.  Anxiety: Continue Lorazepam 1 mg Q 6 hours prn.  Mood Control/Stabilization:  Continue Risperdone 1 mg at bedtime.  Insomnia: Continue Trazodone 50 mg Q bedtime. 2. Continue to monitor mood, behavior and interaction with peers  Sanjuana Kava, NP, PMHNP, FNP-BC 06/19/2016, 3:28 PM

## 2016-06-19 NOTE — Progress Notes (Signed)
D:  Patient's self inventory sheet, patient has fair sleep, no sleep medication given.  Fair appetite, normal energy level, good concentration.  Denied depression, hopeless and anxiety.  Denied withdrawals.  Denied SI.  Denied physical problems.  Denied pain. A:  Medications administered per MD orders.  Emotional support and encouragement given patient. R:  Patient denied SI and HI, contracts for safety.  Denied A/V hallucinations.  Safety maintained with 15 minute checks.

## 2016-06-19 NOTE — Progress Notes (Signed)
Per patient's request order for chaplain's visit entered.

## 2016-06-19 NOTE — Progress Notes (Signed)
[facility] Group Notes:  (Nursing/MHT/Case Management/Adjunct)  Date:  06/19/2016  Time:  9:24 PM  Type of Therapy:  Psychoeducational Skills  Participation Level:  Minimal  Participation Quality:  Attentive  Affect:  Appropriate  Cognitive:  Appropriate  Insight:  Improving  Engagement in Group:  Engaged  Modes of Intervention:  Education  Summary of Progress/Problems:Patient states that she had a good day today and that she had a good visit with her family. Her goal for tomorrow is to go home and be with her family.   Hazle CocaGOODMAN, Avriana Joo S 06/19/2016, 9:24 PM

## 2016-06-19 NOTE — Plan of Care (Signed)
Problem: Coping: Goal: Ability to identify and develop effective coping behavior will improve Outcome: Progressing Nurse discussed depression/anxiety/coping skills with patient.    

## 2016-06-19 NOTE — Progress Notes (Signed)
DAR note   Pt was flat isolative and withdrawn to room; remained in bed with eyes closed. Pt at the time of assessment denied depression, anxiety, SI, HI, Pain, AVH; affect is inconsistent with thought content. No acute distress noted. Support, encouragement, and safe environment provided. 15-minute safety checks continue.

## 2016-06-19 NOTE — [facility] Group Notes (Signed)
  [facility] LCSW Group Therapy  06/19/2016 10 AM  Type of Therapy:  Group Therapy  Participation Level:  Minimal  Participation Quality:  Inattentive  Affect:  Flat  Cognitive:  Lacking  Insight:  Lacking  Engagement in Therapy:  None noted  Modes of Intervention:  Discussion, Education, Exploration, Socialization and Support  Summary of Progress/Problems: Topic for today was thoughts and feelings regarding discharge. Facilitator -presented psycho education pice on self care after engaging patients in a group warm up. Patient engaged only in group warm up and appeared to be distant throughout remainder of group as evidenced by no affect and non blinking stares.   Carney Bernatherine C Harrill, LCSW

## 2016-06-20 MED ORDER — CITALOPRAM HYDROBROMIDE 10 MG PO TABS
15.0000 mg | ORAL_TABLET | Freq: Every day | ORAL | Status: DC
  Administered 2016-06-21 – 2016-06-26 (×6): 15 mg via ORAL
  Filled 2016-06-20 (×6): qty 2
  Filled 2016-06-20: qty 11
  Filled 2016-06-20: qty 2
  Filled 2016-06-20: qty 11
  Filled 2016-06-20: qty 2

## 2016-06-20 MED ORDER — LORAZEPAM 1 MG PO TABS: 1.0000 mg | ORAL_TABLET | Freq: Four times a day (QID) | ORAL | Status: DC | PRN

## 2016-06-20 MED ORDER — RISPERIDONE 0.5 MG PO TABS
1.2500 mg | ORAL_TABLET | Freq: Every day | ORAL | Status: DC
  Filled 2016-06-20 (×3): qty 2.5

## 2016-06-20 NOTE — Progress Notes (Signed)
[facility]  06/20/2016 1:52 PM Deborah Harris  MRN:  [id number] Subjective: Patient states " I am sad, I have a lot of guilt , I tried to talk about all my past mistakes , but I became so anxious and mute.'  Objective:Patient seen and chart reviewed.Discussed patient with treatment team.  Pt today seen as tearful, anxious , labile , continues to talk about being guilty about her past mistakes , not focusing on her studies instead focusing on boys . Pt reports she wants to make the right choice so that she can be a model for her siblings. Several psychosocial stressors like being raised by a single mom, has a 2 yr old sister whom she has to help take care of when mom is at work at night, wants to return to college and so on. Pt per staff continues to need support.     Principal Problem: MDD (major depressive disorder), single episode, severe with psychosis (HCC) Diagnosis:   Patient Active Problem List   Diagnosis Date Noted  . 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 Major depressive disorder with psychotic features (HCC) [F32.3] 06/14/2016  . Acne [L70.9] 05/21/2012  . Ovarian cyst, right [N83.201] 02/27/2012   Total Time spent with patient: 25 minutes  Past Psychiatric History:Please see H&P.   Past Medical History: Please see H&P.  Family History:  Family History  Problem Relation Age of Onset  . Diabetes Maternal Grandmother    Family Psychiatric  History: denies  Social History: Lives with mother and two sisters, one in HS , other only 2 yr old , mother is employed, she has a boyfriend , she wants to get back to college , may be [employer] . History  Alcohol Use No     History  Drug Use No    Social History   Social History  . Marital status: Single    Spouse name: N/A  . Number of children: N/A  . Years of education: N/A   Social History Main Topics  . Smoking status: Never Smoker  . Smokeless  tobacco: Never Used  . Alcohol use No  . Drug use: No  . Sexual activity: Yes   Other Topics Concern  . None   Social History Narrative  . None   Additional Social History:                         Sleep: Fair  Appetite:  Fair  Current Medications: Current Facility-Administered Medications  Medication Dose Route Frequency Provider Last Rate Last Dose  . acetaminophen (TYLENOL) tablet 650 mg  650 mg Oral Q6H PRN Oneta RackLewis, Tanika N, NP      . alum & mag hydroxide-simeth (MAALOX/MYLANTA) 200-200-20 MG/5ML suspension 30 mL  30 mL Oral Q4H PRN Oneta RackLewis, Tanika N, NP      . Melene Muller[START ON 06/21/2016] citalopram (CELEXA) tablet 15 mg  15 mg Oral Daily Juanice Warburton, MD      . LORazepam (ATIVAN) tablet 1 mg  1 mg Oral Q6H PRN Lanell Carpenter, MD      . magnesium hydroxide (MILK OF MAGNESIA) suspension 30 mL  30 mL Oral Daily PRN Lewis, Tanika N, NP      . risperiDONE (RISPERDAL) tablet 1.25 mg  1.25 mg Oral QHS Daelin Haste, MD      . traZODone (DESYREL) tablet 50 mg  50 mg Oral QHS PRN Oneta RackLewis, Tanika N, NP  Lab Results:  Results for orders placed or performed during the hospital encounter of 06/17/16 (from the past 48 hour(s))  TSH     Status: None   Collection Time: 06/19/16  6:18 AM  Result Value Ref Range   TSH 1.503 0.350 - 4.500 uIU/mL    Comment: Performed by a 3rd Generation assay with a functional sensitivity of <=0.01 uIU/mL. Performed at [facility], [address], [place], [place] [address]   Lipid panel     Status: None   Collection Time: 06/19/16  6:18 AM  Result Value Ref Range   Cholesterol 127 0 - 200 mg/dL   Triglycerides 34 <528 mg/dL   HDL 49 >41 mg/dL   Total CHOL/HDL Ratio 2.6 RATIO   VLDL 7 0 - 40 mg/dL   LDL Cholesterol 71 0 - 99 mg/dL    Comment:        Total Cholesterol/HDL:CHD Risk Coronary Heart Disease Risk Table                     Men   Women  1/2 Average Risk   3.4   3.3  Average Risk       5.0   4.4  2 X  Average Risk   9.6   7.1  3 X Average Risk  23.4   11.0        Use the calculated Patient Ratio above and the CHD Risk Table to determine the patient's CHD Risk.        ATP III CLASSIFICATION (LDL):  <100     mg/dL   Optimal  324-401  mg/dL   Near or Above                    Optimal  130-159  mg/dL   Borderline  027-253  mg/dL   High  >664     mg/dL   Very High Performed at [facility], [address], [place], [place] [address]     Blood Alcohol level:  Lab Results  Component Value Date   ETH <5 06/13/2016    Metabolic Disorder Labs: No results found for: HGBA1C, MPG No results found for: PROLACTIN Lab Results  Component Value Date   CHOL 127 06/19/2016   TRIG 34 06/19/2016   HDL 49 06/19/2016   CHOLHDL 2.6 06/19/2016   VLDL 7 06/19/2016   LDLCALC 71 06/19/2016    Physical Findings: AIMS: Facial and Oral Movements Muscles of Facial Expression: None, normal Lips and Perioral Area: None, normal Jaw: None, normal Tongue: None, normal,Extremity Movements Upper (arms, wrists, hands, fingers): None, normal Lower (legs, knees, ankles, toes): None, normal, Trunk Movements Neck, shoulders, hips: None, normal, Overall Severity Severity of abnormal movements (highest score from questions above): None, normal Incapacitation due to abnormal movements: None, normal Patient's awareness of abnormal movements (rate only patient's report): No Awareness, Dental Status Current problems with teeth and/or dentures?: No Does patient usually wear dentures?: No  CIWA:  CIWA-Ar Total: 1 COWS:  COWS Total Score: 1  Musculoskeletal: Strength & Muscle Tone: within normal limits Gait & Station: normal Patient leans: N/A  Psychiatric Specialty Exam: Physical Exam  Nursing note and vitals reviewed.   Review of Systems  Psychiatric/Behavioral: Positive for depression. The patient is nervous/anxious.   All other systems reviewed and are negative.   Blood pressure (!) 111/51,  pulse 97, temperature 98.5 F (36.9 C), temperature source Oral, resp. rate 18, height 5' 5.5" (  1.664 m), weight 42.9 kg (94 lb 9.6 oz).Body mass index is 15.5 kg/m.  General Appearance: Guarded  Eye Contact:  Fair  Speech:  Slow  Volume:  Decreased  Mood:  Anxious, Depressed and Dysphoric  Affect:  Tearful  Thought Process:  Irrelevant and Descriptions of Associations: Circumstantial  Orientation:  Full (Time, Place, and Person)  Thought Content:  Rumination  Suicidal Thoughts:  No  Homicidal Thoughts:  No  Memory:  Immediate;   Fair Recent;   Fair Remote;   Fair  Judgement:  Impaired  Insight:  Shallow  Psychomotor Activity:  Restlessness  Concentration:  Concentration: Fair and Attention Span: Fair  Recall:  [employer] of Knowledge:  Fair  Language:  Fair  Akathisia:  No  Handed:  Right  AIMS (if indicated):     Assets:  Desire for Improvement  ADL's:  Intact  Cognition:  WNL  Sleep:  Number of Hours: 6.75     Treatment Plan Summary:Patient with depression, anxiety sx, is labile and sad, tearful often , will continue to need support and treatment. Daily contact with patient to assess and evaluate symptoms and progress in treatment, Medication management and Plan see below  Increase Celexa to 15 mg po daily for affective sx. Will increase Risperidone to 1.25 mg po qhs for  Augmenting the Celexa. Trazodone 50 mg po qhs prn for insomnia. CSW will continue to work on disposition - she will need intensive therapy - possibly CBT.     Saralyn Willison, MD 06/20/2016, 1:52 PM

## 2016-06-20 NOTE — Plan of Care (Signed)
Problem: Coping: Goal: Ability to identify and develop effective coping behavior will improve Outcome: Progressing Nurse discussed depression/anxiety/coping skills with patient.    

## 2016-06-20 NOTE — Progress Notes (Signed)
Adult Psychoeducational Group Note  Date:  06/20/2016 Time:  8:50 PM  Group Topic/Focus:  Wrap-Up Group:   The focus of this group is to help patients review their daily goal of treatment and discuss progress on daily workbooks.  Participation Level:  Active  Participation Quality:  Appropriate  Affect:  Appropriate  Cognitive:  Appropriate  Insight: Appropriate  Engagement in Group:  Engaged  Modes of Intervention:  Discussion  Additional Comments: The patient expressed that she rates today a 10.The patient also said that she attended groups.  Octavio Mannshigpen, Orva Riles Lee 06/20/2016, 8:50 PM

## 2016-06-20 NOTE — Progress Notes (Signed)
Pt was flat isolative and withdrawn to self. Pt is preoccupied about going home. "my family came to visit today; I will ask the doctor if I can go home tomorrow. Pt at the time of assessment denied depression, anxiety, SI, HI, Pain, AVH A: Medications offered as prescribed. All patient's questions and concerns addressed. Support, encouragement, and safe environment provided. 15-minute safety checks continue. R: Pt was med compliant.  Pt attended wrap-up group. Safety checks continue.

## 2016-06-20 NOTE — [facility] Group Notes (Signed)
[facility] LCSW Group Therapy  06/20/2016 1:15 pm  Type of Therapy: Process Group Therapy  Participation Level:  Active  Participation Quality:  Appropriate  Affect:  Flat  Cognitive:  Oriented  Insight:  Improving  Engagement in Group:  Limited  Engagement in Therapy:  Limited  Modes of Intervention:  Activity, Clarification, Education, Problem-solving and Support  Summary of Progress/Problems: Today's group addressed the issue of overcoming obstacles.  Patients were asked to identify their biggest obstacle post d/c that stands in the way of their on-going success, and then problem solve as to how to manage this. Stayed for 15 minutes.  Left before we were able to engaged her.  No contributions.  Daryel Geraldorth, Alyza Artiaga B 06/20/2016   4:03 PM

## 2016-06-20 NOTE — Progress Notes (Signed)
Recreation Therapy Notes  Date: 06/20/16 Time: 1000 Location: 500 Hall Dayroom  Group Topic: Coping Skills  Goal Area(s) Addresses:  Patients will be able to identify coping skills. Patients will be able to identify the importance of coping skills. Patients will be able to identify the importance of coping skills post d/c.  Behavioral Response: Minimal  Intervention: Mind map, pencils  Activity: Mind map.  Patients were given a mind map to fill out.  LRT and patients filled in the first 8 boxes together (discipline, school, drugs, health, family, job, relationships and money).  Patients were to then come up with coping skills for each situation on their own.  The group then came back together and LRT filled in the coping skills on the board.  Patients would then fill in any holes they had on their sheets.  Education: PharmacologistCoping Skills, Building control surveyorDischarge Planning.   Education Outcome: Acknowledges understanding/In group clarification offered/Needs additional education.   Clinical Observations/Feedback: Pt sat quietly at the back of the room.  Pt was observed filling in her sheet.      Caroll RancherMarjette Oluwadarasimi Favor, LRT/CTRS         Lillia AbedLindsay, Astor Gentle A 06/20/2016 1:00 PM

## 2016-06-20 NOTE — Progress Notes (Signed)
Chaplain consult with pt d/t nursing referral.  Met with Deborah Harris in room 502.   She was responsive to chaplain presence and requested prayers, though did not engage in conversation around what precipitated admission to [facility] or other spiritual concerns.   When chaplain inquired about ways that she prays or connects with god, [name] was concerned whether there "was a right way to pray."  Engaged in thinking about ways that were right for her and offered affirmation.     Stated that nurses and Social workers had been checking in with her and felt "some things are clearing up in my head."  Is a member of a church in [place] and reported that members know that she is at [facility] and she feels supported in their prayers for her.    She requested prayers for school (where she hopes to study occupational therapy).   [facility] / [place], [place]  Page [phone]  Office [phone]

## 2016-06-20 NOTE — Progress Notes (Signed)
D:  Patient's self inventory sheet, patient sleeps good, no sleep medication given.  Good appetite, high energy level, good concentration.  Denied depression, hopeless and anxiety.  Denied withdrawals.  Denied SI.  Denied physical problems,  Denied physical pain.  Goal is discharge.  Plans discharge.  "Thank you.!"  Does have discharge plan. A:  Medications administered per MD orders.  Emotional support and encouragement given patient. R:  Denied SI and HI, contracts for safety.  Denied A/V hallucinations.  Safety maintained with 15 minute checks.

## 2016-06-20 NOTE — Tx Team (Signed)
Interdisciplinary Treatment and Diagnostic Plan Update  06/20/2016 Time of Session: 11:01 AM  Deborah Harris MRN: [id number]  Principal Diagnosis: Schizophreniform disorder [facility])  Secondary Diagnoses: Principal Problem:   Schizophreniform disorder ([place]) Active Problems:   MDD (major depressive disorder), single episode, severe with psychosis ([place])   Major depressive disorder, recurrent, severe with psychotic features ([place])   Current Medications:  Current Facility-Administered Medications  Medication Dose Route Frequency Provider Last Rate Last Dose  . acetaminophen (TYLENOL) tablet 650 mg  650 mg Oral Q6H PRN Derrill Center, NP      . alum & mag hydroxide-simeth (MAALOX/MYLANTA) 200-200-20 MG/5ML suspension 30 mL  30 mL Oral Q4H PRN Derrill Center, NP      . citalopram (CELEXA) tablet 10 mg  10 mg Oral Daily Patrecia Pour, NP   10 mg at 06/20/16 0747  . LORazepam (ATIVAN) tablet 1 mg  1 mg Oral Q6H PRN Patrecia Pour, NP      . magnesium hydroxide (MILK OF MAGNESIA) suspension 30 mL  30 mL Oral Daily PRN Derrill Center, NP      . risperiDONE (RISPERDAL) tablet 1 mg  1 mg Oral QHS Izediuno, Laruth Bouchard, MD   1 mg at 06/19/16 2218  . traZODone (DESYREL) tablet 50 mg  50 mg Oral QHS PRN Derrill Center, NP        PTA Medications: No prescriptions prior to admission.    Treatment Modalities: Medication Management, Group therapy, Case management,  1 to 1 session with clinician, Psychoeducation, Recreational therapy.   Physician Treatment Plan for Primary Diagnosis: Schizophreniform disorder ([place]) Long Term Goal(s): Improvement in symptoms so as ready for discharge  Short Term Goals: Ability to identify changes in lifestyle to reduce recurrence of condition will improve Ability to verbalize feelings will improve Ability to identify and develop effective coping behaviors will improve Ability to identify changes in lifestyle to reduce recurrence of condition will improve Ability  to verbalize feelings will improve Ability to identify and develop effective coping behaviors will improve Ability to identify triggers associated with substance abuse/mental health issues will improve  Medication Management: Evaluate patient's response, side effects, and tolerance of medication regimen.  Therapeutic Interventions: 1 to 1 sessions, Unit Group sessions and Medication administration.  Evaluation of Outcomes: Adequate for Discharge  Physician Treatment Plan for Secondary Diagnosis: Principal Problem:   Schizophreniform disorder ([place]) Active Problems:   MDD (major depressive disorder), single episode, severe with psychosis ([place])   Major depressive disorder, recurrent, severe with psychotic features ([place])   Long Term Goal(s): Improvement in symptoms so as ready for discharge  Short Term Goals: Ability to identify changes in lifestyle to reduce recurrence of condition will improve Ability to verbalize feelings will improve Ability to identify and develop effective coping behaviors will improve Ability to identify changes in lifestyle to reduce recurrence of condition will improve Ability to verbalize feelings will improve Ability to identify and develop effective coping behaviors will improve Ability to identify triggers associated with substance abuse/mental health issues will improve  Medication Management: Evaluate patient's response, side effects, and tolerance of medication regimen.  Therapeutic Interventions: 1 to 1 sessions, Unit Group sessions and Medication administration.  Evaluation of Outcomes: Adequate for Discharge   RN Treatment Plan for Primary Diagnosis: Schizophreniform disorder [facility]) Long Term Goal(s): Knowledge of disease and therapeutic regimen to maintain health will improve  Short Term Goals: Ability to identify and develop effective coping behaviors will improve and Compliance with prescribed medications  will improve  Medication Management: RN  will administer medications as ordered by provider, will assess and evaluate patient's response and provide education to patient for prescribed medication. RN will report any adverse and/or side effects to prescribing provider.  Therapeutic Interventions: 1 on 1 counseling sessions, Psychoeducation, Medication administration, Evaluate responses to treatment, Monitor vital signs and CBGs as ordered, Perform/monitor CIWA, COWS, AIMS and Fall Risk screenings as ordered, Perform wound care treatments as ordered.  Evaluation of Outcomes: Adequate for Discharge   LCSW Treatment Plan for Primary Diagnosis: Schizophreniform disorder [facility]) Long Term Goal(s): Safe transition to appropriate next level of care at discharge, Engage patient in therapeutic group addressing interpersonal concerns.  Short Term Goals: Engage patient in aftercare planning with referrals and resources  Therapeutic Interventions: Assess for all discharge needs, 1 to 1 time with Social worker, Explore available resources and support systems, Assess for adequacy in community support network, Educate family and significant other(s) on suicide prevention, Complete Psychosocial Assessment, Interpersonal group therapy.  Evaluation of Outcomes: Met   Return home, follow up [facility]    Progress in Treatment: Attending groups: Yes Participating in groups: Yes Taking medication as prescribed: Yes Toleration medication: Yes, no side effects reported at this time Family/Significant other contact made: Yes Patient understands diagnosis: No  Limited insight Discussing patient identified problems/goals with staff: Yes Medical problems stabilized or resolved: Yes Denies suicidal/homicidal ideation: Yes Issues/concerns per patient self-inventory: None Other: N/A  New problem(s) identified: None identified at this time.   New Short Term/Long Term Goal(s): None identified at this time.   Discharge Plan or Barriers:   Reason for  Continuation of Hospitalization: [place] Paranoia Medication stabilization   Estimated Length of Stay: Likely d/c tomorrow  Attendees: Patient: 06/20/2016  11:01 AM  Physician: Ursula Alert, MD 06/20/2016  11:01 AM  Nursing: Hoy Register, RN 06/20/2016  11:01 AM  RN Care Manager: Lars Pinks, RN 06/20/2016  11:01 AM  Social Worker: Ripley Fraise 06/20/2016  11:01 AM  Recreational Therapist: Marjette  06/20/2016  11:01 AM  Other: Norberto Sorenson 06/20/2016  11:01 AM  Other:  06/20/2016  11:01 AM    Scribe for Treatment Team:  Roque Lias LCSW 06/20/2016 11:01 AM

## 2016-06-21 MED ORDER — RISPERIDONE 1 MG PO TABS
1.2500 mg | ORAL_TABLET | Freq: Every day | ORAL | Status: DC
  Administered 2016-06-25: 1.25 mg via ORAL
  Filled 2016-06-21 (×7): qty 1

## 2016-06-21 NOTE — [facility] Group Notes (Signed)
[facility] Group Notes:  (Nursing/MHT/Case Management/Adjunct)  Date:  06/21/2016  Time:  0900 am  Type of Therapy:  Psychoeducational Skills  Participation Level:  Did Not Attend   Cranford MonBeaudry, Sylina Henion Evans 06/21/2016, 5:59 PM

## 2016-06-21 NOTE — Progress Notes (Signed)
Patient requested her 15 mg of celexa.  Her mother and father were visiting with patient and mother requested nurse get medication for patient.  Asked patient if she was willing to take it and she indicated "yes."  Mother's name was added to consent list by patient.  Gave 15 mg celexa to patient and before she took it she asked if she would be discharged tomorrow.  Informed patient that we did not have that information, as only the MD makes that decision.  She reluctantly took the 15 mg of celexa.

## 2016-06-21 NOTE — [facility] Group Notes (Signed)
[facility] LCSW Group Therapy 06/21/2016 1:15 PM  Type of Therapy: Group Therapy- Feelings about Diagnosis  Participation Level: Pt was present for the duration of the group. Did not participate in the discussion.  Jonathon JordanLynn B Danialle Dement, MSW, LCSWA [phone] 06/21/2016 4:33 PM\

## 2016-06-21 NOTE — Progress Notes (Signed)
D: Patient was moved from 500 hall earlier.  She is appropriate for 400 hall.  She does exhibit some thoughts blocking and has an intense gaze.  Per 500 staff, patient is taking any medications.  Patient is now voluntary status per MD. She remains isolative and withdrawn.  She gives short answers such as "I'm fine."  "I'm ok."  She does not elaborate on her feelings or emotions. Patient is attending group with little participation.  Patient is observed sitting on the bed with her bag of belongings sitting beside her.  She is preoccupied with discharge. A:  Continue to monitor medication management and MD orders.  Safety checks continued every 15 minutes per protocol.  Offer support and encouragement as needed. R: Patient has minimal interaction with staff and peers.

## 2016-06-21 NOTE — [facility] Suicide Risk Assessment (Addendum)
[facility] INPATIENT:  Family/Significant Other Suicide Prevention Education  Suicide Prevention Education:  Education Completed; Alexandria Flores-Sanchez [phone] significant other) has been identified by the patient as the family member/significant other with whom the patient will be residing, and identified as the person(s) who will aid the patient in the event of a mental health crisis (suicidal ideations/suicide attempt).  With written consent from the patient, the family member/significant other has been provided the following suicide prevention education, prior to the and/or following the discharge of the patient.  The suicide prevention education provided includes the following:  Suicide risk factors  Suicide prevention and interventions  National Suicide Hotline telephone number  [facility] assessment telephone number  [facility] Emergency Assistance 911  [facility] and/or Residential Mobile Crisis Unit telephone number  Request made of family/significant other to:  Remove weapons (e.g., guns, rifles, knives), all items previously/currently identified as safety concern.    Remove drugs/medications (over-the-counter, prescriptions, illicit drugs), all items previously/currently identified as a safety concern.  The family member/significant other verbalizes understanding of the suicide prevention education information provided.  The family member/significant other agrees to remove the items of safety concern listed above. Mother states pt picked up the knife to attempt to cut a pineapple. Mother states she has set up counseling for her her through her pastor.  We went over treatment team recommendations and crises planning.  Ida RogueRodney B Derry Arbogast 06/21/2016, 2:26 PM

## 2016-06-21 NOTE — Progress Notes (Signed)
[facility] MD Progress Note  06/21/2016 2:10 PM Deborah Harris  MRN:  [id number] Subjective: Patient states " I do not know if I should be on all this medications."  Objective:Patient seen and chart reviewed.Discussed patient with treatment team.  Pt seen as staring often, some though blocking. Pt refused all her medications this AM as well as last night. She is seen as sad and tearful at times. Sometime was spent with pt teaching her about her condition as well as medications. Also discussed with CSW to contact family for support and pt encouragement. RN also to educate pt about her medications and need for compliance.     Principal Problem: MDD (major depressive disorder), single episode, severe with psychosis ([facility]) Diagnosis:   Patient Active Problem List   Diagnosis Date Noted  . 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 Major depressive disorder with psychotic features ([facility]) [F32.3] 06/14/2016  . Acne [L70.9] 05/21/2012  . Ovarian cyst, right [N83.201] 02/27/2012   Total Time spent with patient: 20 minutes  Past Psychiatric History:Please see H&P.   Past Medical History: Please see H&P.  Family History:  Family History  Problem Relation Age of Onset  . Diabetes Maternal Grandmother    Family Psychiatric  History: denies  Social History: Lives with mother and two sisters, one in HS , other only 2 yr old , mother is employed, she has a boyfriend , she wants to get back to college , may be [employer] . History  Alcohol Use No     History  Drug Use No    Social History   Social History  . Marital status: Single    Spouse name: N/A  . Number of children: N/A  . Years of education: N/A   Social History Main Topics  . Smoking status: Never Smoker  . Smokeless tobacco: Never Used  . Alcohol use No  . Drug use: No  . Sexual activity: Yes   Other Topics Concern  . None   Social History Narrative  . None    Additional Social History:                         Sleep: Fair  Appetite:  Fair  Current Medications: Current Facility-Administered Medications  Medication Dose Route Frequency Provider Last Rate Last Dose  . acetaminophen (TYLENOL) tablet 650 mg  650 mg Oral Q6H PRN Oneta Rack, NP      . alum & mag hydroxide-simeth (MAALOX/MYLANTA) 200-200-20 MG/5ML suspension 30 mL  30 mL Oral Q4H PRN Oneta Rack, NP      . citalopram (CELEXA) tablet 15 mg  15 mg Oral Daily Sencere Symonette, MD      . LORazepam (ATIVAN) tablet 1 mg  1 mg Oral Q6H PRN Delshawn Stech, MD      . magnesium hydroxide (MILK OF MAGNESIA) suspension 30 mL  30 mL Oral Daily PRN Oneta Rack, NP      . risperiDONE (RISPERDAL) tablet 1.25 mg  1.25 mg Oral QHS Eriq Hufford, MD      . traZODone (DESYREL) tablet 50 mg  50 mg Oral QHS PRN Oneta Rack, NP        Lab Results:  No results found for this or any previous visit (from the past 48 hour(s)).  Blood Alcohol level:  Lab Results  Component Value Date   ETH <5 06/13/2016    Metabolic Disorder Labs: No  results found for: HGBA1C, MPG No results found for: PROLACTIN Lab Results  Component Value Date   CHOL 127 06/19/2016   TRIG 34 06/19/2016   HDL 49 06/19/2016   CHOLHDL 2.6 06/19/2016   VLDL 7 06/19/2016   LDLCALC 71 06/19/2016    Physical Findings: AIMS: Facial and Oral Movements Muscles of Facial Expression: None, normal Lips and Perioral Area: None, normal Jaw: None, normal Tongue: None, normal,Extremity Movements Upper (arms, wrists, hands, fingers): None, normal Lower (legs, knees, ankles, toes): None, normal, Trunk Movements Neck, shoulders, hips: None, normal, Overall Severity Severity of abnormal movements (highest score from questions above): None, normal Incapacitation due to abnormal movements: None, normal Patient's awareness of abnormal movements (rate only patient's report): No Awareness, Dental Status Current  problems with teeth and/or dentures?: No Does patient usually wear dentures?: No  CIWA:  CIWA-Ar Total: 1 COWS:  COWS Total Score: 2  Musculoskeletal: Strength & Muscle Tone: within normal limits Gait & Station: normal Patient leans: N/A  Psychiatric Specialty Exam: Physical Exam  Nursing note and vitals reviewed.   Review of Systems  Psychiatric/Behavioral: Positive for depression. The patient is nervous/anxious.   All other systems reviewed and are negative.   Blood pressure 117/68, pulse 88, temperature 98.4 F (36.9 C), temperature source Oral, resp. rate 16, height 5' 5.5" (1.664 m), weight 42.9 kg (94 lb 9.6 oz).Body mass index is 15.5 kg/m.  General Appearance: Guarded  Eye Contact:  Fair, staring often  Speech:  Slow, continues to have some thought blocking  Volume:  Decreased  Mood:  Anxious, Depressed and Dysphoric  Affect:  Tearful  Thought Process:  Irrelevant and Descriptions of Associations: Circumstantial more linear  Orientation:  Full (Time, Place, and Person)  Thought Content:  Rumination  Suicidal Thoughts:  No  Homicidal Thoughts:  No  Memory:  Immediate;   Fair Recent;   Fair Remote;   Fair  Judgement:  Impaired  Insight:  Shallow  Psychomotor Activity:  Restlessness  Concentration:  Concentration: Fair and Attention Span: Fair  Recall:  [employer]Fair  Fund of Knowledge:  Fair  Language:  Fair  Akathisia:  No  Handed:  Right  AIMS (if indicated):     Assets:  Desire for Improvement  ADL's:  Intact  Cognition:  WNL  Sleep:  Number of Hours: 6.75   Will continue today 06/21/16  plan as below except where it is noted.   Treatment Plan Summary:Patient with depression as well as anxiety sx , guilt , hopelessness , continues to need treatment , however has been refusing medications. Will continue to educate and encourage.  Daily contact with patient to assess and evaluate symptoms and progress in treatment, Medication management and Plan see below    Celexa  15 mg po daily for affective sx. Will continue Risperidone 1.25 mg po qhs for  Augmenting the Celexa. Trazodone 50 mg po qhs prn for insomnia. CSW will continue to work on disposition - she will need intensive therapy - possibly CBT.     [name], MD 06/21/2016, 2:10 PM

## 2016-06-21 NOTE — Progress Notes (Signed)
Recreation Therapy Notes  INPATIENT RECREATION THERAPY ASSESSMENT  Patient Details Name: Deborah Harris MRN: [id number] DOB: 04/22/1995 Today's Date: 06/21/2016  Patient Stressors: Other (Comment) (Not communicating with family)  Pt stated she was here because she had a lot of stress going on.  Coping Skills:   Isolate, Avoidance, Exercise, Art/Dance, Talking, Music, Sports  Personal Challenges: Decision-Making, Self-Esteem/Confidence  Leisure Interests (2+):  Social - Friends, Individual - Napping, Individual - Other (Comment) (Pamper self)  Awareness of Community Resources:  Yes  Community Resources:  Research scientist (physical sciences)Movie Theaters, [employer], [place] [employer], [employer]  Current Use: Yes  Patient Strengths:  Strong will, caring  Patient Identified Areas of Improvement:  Being more okay with crowds; stop caring what I look like  Current Recreation Participation:  Everyday  Patient Goal for Hospitalization:  "Be more involved in loved ones lives"  [place]  [place]  Current [place] (including self-harm):  No  Current HI:  No  Consent to Intern Participation: N/A   Caroll RancherMarjette Bobbe Quilter, LRT/CTRS  Caroll RancherLindsay, Jackqulyn Mendel A 06/21/2016, 8:56 AM

## 2016-06-21 NOTE — [facility] Group Notes (Signed)
[facility] LCSW Group Therapy  06/21/2016 2:17 PM   Type of Therapy:  Group Therapy  Participation Level:  Active  Participation Quality:  Attentive  Affect:  Appropriate  Cognitive:  Appropriate  Insight:  Improving  Engagement in Therapy:  Engaged  Modes of Intervention:  Clarification, Education, Exploration and Socialization  Summary of Progress/Problems: Today's group focused on relapse prevention.  We defined the term, and then brainstormed on ways to prevent relapse. Invited.  Chose to not attend.  Daryel Geraldorth, Sorah Falkenstein B 06/21/2016 , 2:17 PM

## 2016-06-21 NOTE — Progress Notes (Signed)
Recreation Therapy Notes  Animal-Assisted Activity (AAA) Program Checklist/Progress Notes Patient Eligibility Criteria Checklist & Daily Group note for Rec TxIntervention  Date: 06/21/2016 Time: 2:55pm Location: 400 hall dayroom  AAA/T Program Assumption of Risk Form signed by Patient/ or Parent Legal Guardian Yes  Patient is free of allergies or sever asthma Yes  Patient reports no fear of animals Yes  Patient reports no history of cruelty to animals Yes  Patient understands his/her participation is voluntary Yes  Patient washes hands before animal contact Yes  Patient washes hands after animal contact Yes  Behavioral Response: passively engaged  Education:Hand Washing, Appropriate Animal Interaction   Education Outcome: Acknowledges education.   Clinical Observations/Feedback: Patient attended session and interacted appropriately with therapy dog and peers.  Marvell Fullerachel Coti Burd, Recreation Therapy Intern

## 2016-06-21 NOTE — Progress Notes (Signed)
D: Pt was flat isolative and withdrawn to self. Pt is preoccupied about going home; Pt had her family come to pick her up saying she was been d/ced. Pt at the time of assessment denied depression, anxiety, SI, HI, Pain, AVH.  A: Medications offered as prescribed. All patient's questions and concerns addressed. Support, encouragement, and safe environment provided. 15-minute safety checks continue. R: Pt refused 2200 medications.  Pt attended wrap-up group. Safety checks continue.

## 2016-06-22 NOTE — Progress Notes (Signed)
D: Patient remains isolative and withdrawn.  She did agree to take her 15 mg of celexa this morning.  Patient appears preoccupied and has intense gaze.  She is focused on discharge.  Patient denies any thoughts of self harm.  Patient gives short answers "I'm fine."  "I'm ok."  Patient is cooperative today and visible in the milieu at times, however, she does not interact with her peers.   A: Continue to monitor medication management and MD orders.  Safety checks completed every 15 minutes per protocol.  Offer support and encouragement as needed. R: Patient is isolative and withdrawn.  She is resistant to treatment.

## 2016-06-22 NOTE — Progress Notes (Signed)
Adult Psychoeducational Group Note  Date:  06/22/2016 Time:  5:16 AM  Group Topic/Focus:  Wrap-Up Group:   The focus of this group is to help patients review their daily goal of treatment and discuss progress on daily workbooks.  Participation Level:  Did Not Attend  Additional Comments: Pt did not attend group, pt remained in her room, encouraged to attend.  Karleen HampshireFox, Anamae Rochelle Brittini 06/22/2016, 5:16 AM

## 2016-06-22 NOTE — [facility] Group Notes (Addendum)
[facility] 06/22/2016 1:15pm  Type of Therapy: Mental Health Association Presentation  Participation Level: Pt invited. Did not attend.   Deborah BackersLynn B. Beverely PaceBryant, MSW, [facility] 06/22/2016 3:31 PM

## 2016-06-22 NOTE — Progress Notes (Signed)
Deborah Harris. Ivry had been up and visible in milieu this evening, did not attend evening group activity. She had minimal to no interaction with peers in milieu and refused scheduled bedtime medications. She was seen in bed not sleeping this evening and was asked if she wanted anything to help with sleep and initially refused but after some encouragement eventually did take some trazodone. Deborah SkillernDaniela also did not verbalize any complaints of pain this evening. A. Support and encouragement provided. R. Safety maintained, will continue to monitor.

## 2016-06-22 NOTE — Progress Notes (Signed)
Adult Psychoeducational Group Note  Date:  06/22/2016 Time:  11:58 PM  Group Topic/Focus:  Wrap-Up Group:   The focus of this group is to help patients review their daily goal of treatment and discuss progress on daily workbooks.  Participation Level:  Did Not Attend  Participation Quality:  Did not attend  Affect:  Did not attend  Cognitive:  Did not attend  Insight: None  Engagement in Group:  Did not attend  Modes of Intervention:  Did not attend  Additional Comments:  Patient did not attend wrap up group tonight.   Deborah Harris L Seng Fouts 06/22/2016, 11:58 PM

## 2016-06-22 NOTE — Progress Notes (Signed)
[facility] MD Progress Note  06/22/2016 4:33 PM Deborah Harris  MRN:  [id number] Subjective: Patient states " I am still thinking about my past , but I am feeling better.'   Objective:Patient seen and chart reviewed.Discussed patient with treatment team.  Pt today seen as less anxious , continues to have staring often, however her affect is more reactive and less tearful. She took her celexa this AM , declined the risperidone. Per staff patient continues to have an inappropriate affect at times. Pt continues to be isolative often and needs a lot of support.      Principal Problem: MDD (major depressive disorder), single episode, severe with psychosis (HCC) Diagnosis:   Patient Active Problem List   Diagnosis Date Noted  . 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 Major depressive disorder with psychotic features (HCC) [F32.3] 06/14/2016  . Acne [L70.9] 05/21/2012  . Ovarian cyst, right [N83.201] 02/27/2012   Total Time spent with patient: 20 minutes  Past Psychiatric History:Please see H&P.   Past Medical History: Please see H&P.  Family History:  Family History  Problem Relation Age of Onset  . Diabetes Maternal Grandmother    Family Psychiatric  History: denies  Social History: Lives with mother and two sisters, one in HS , other only 2 yr old , mother is employed, she has a boyfriend , she wants to get back to college , may be [employer] . History  Alcohol Use No     History  Drug Use No    Social History   Social History  . Marital status: Single    Spouse name: N/A  . Number of children: N/A  . Years of education: N/A   Social History Main Topics  . Smoking status: Never Smoker  . Smokeless tobacco: Never Used  . Alcohol use No  . Drug use: No  . Sexual activity: Yes   Other Topics Concern  . None   Social History Narrative  . None   Additional Social History:                         Sleep:  Fair  Appetite:  Fair  Current Medications: Current Facility-Administered Medications  Medication Dose Route Frequency Provider Last Rate Last Dose  . acetaminophen (TYLENOL) tablet 650 mg  650 mg Oral Q6H PRN Oneta Rack, NP      . alum & mag hydroxide-simeth (MAALOX/MYLANTA) 200-200-20 MG/5ML suspension 30 mL  30 mL Oral Q4H PRN Oneta Rack, NP      . citalopram (CELEXA) tablet 15 mg  15 mg Oral Daily Faust Thorington, Levin Bacon, MD   15 mg at 06/22/16 0820  . LORazepam (ATIVAN) tablet 1 mg  1 mg Oral Q6H PRN Yuriko Portales, MD      . magnesium hydroxide (MILK OF MAGNESIA) suspension 30 mL  30 mL Oral Daily PRN Lewis, Tanika N, NP      . risperiDONE (RISPERDAL) tablet 1.25 mg  1.25 mg Oral Q2000 Emiline Mancebo, MD      . traZODone (DESYREL) tablet 50 mg  50 mg Oral QHS PRN Oneta Rack, NP   50 mg at 06/22/16 0014    Lab Results:  No results found for this or any previous visit (from the past 48 hour(s)).  Blood Alcohol level:  Lab Results  Component Value Date   ETH <5 06/13/2016    Metabolic Disorder Labs: No results found for: HGBA1C,  MPG No results found for: PROLACTIN Lab Results  Component Value Date   CHOL 127 06/19/2016   TRIG 34 06/19/2016   HDL 49 06/19/2016   CHOLHDL 2.6 06/19/2016   VLDL 7 06/19/2016   LDLCALC 71 06/19/2016    Physical Findings: AIMS: Facial and Oral Movements Muscles of Facial Expression: None, normal Lips and Perioral Area: None, normal Jaw: None, normal Tongue: None, normal,Extremity Movements Upper (arms, wrists, hands, fingers): None, normal Lower (legs, knees, ankles, toes): None, normal, Trunk Movements Neck, shoulders, hips: None, normal, Overall Severity Severity of abnormal movements (highest score from questions above): None, normal Incapacitation due to abnormal movements: None, normal Patient's awareness of abnormal movements (rate only patient's report): No Awareness, Dental Status Current problems with teeth and/or  dentures?: No Does patient usually wear dentures?: No  CIWA:  CIWA-Ar Total: 1 COWS:  COWS Total Score: 2  Musculoskeletal: Strength & Muscle Tone: within normal limits Gait & Station: normal Patient leans: N/A  Psychiatric Specialty Exam: Physical Exam  Nursing note and vitals reviewed.   Review of Systems  Psychiatric/Behavioral: Positive for depression. The patient is nervous/anxious.   All other systems reviewed and are negative.   Blood pressure (!) 105/59, pulse (!) 127, temperature 98.4 F (36.9 C), temperature source Oral, resp. rate 16, height 5' 5.5" (1.664 m), weight 42.9 kg (94 lb 9.6 oz).Body mass index is 15.5 kg/m.  General Appearance: Guarded  Eye Contact:  Fair staring at times , improving  Speech:  Normal Rate  Volume:  Normal  Mood:  Anxious and Depressed  Affect:  Congruent  Thought Process:  Goal Directed and Descriptions of Associations: Circumstantial  Orientation:  Full (Time, Place, and Person)  Thought Content:  Rumination  Suicidal Thoughts:  No  Homicidal Thoughts:  No  Memory:  Immediate;   Fair Recent;   Fair Remote;   Fair  Judgement:  Impaired  Insight:  Shallow  Psychomotor Activity:  Restlessness  Concentration:  Concentration: Fair and Attention Span: Fair  Recall:  FiservFair  Fund of Knowledge:  Fair  Language:  Fair  Akathisia:  No  Handed:  Right  AIMS (if indicated):     Assets:  Desire for Improvement  ADL's:  Intact  Cognition:  WNL  Sleep:  Number of Hours: 5.75   Will continue today 06/22/16  plan as below except where it is noted.   Treatment Plan Summary:Patient with depression as well as anxiety issues , presents with partial improvement , is more compliant with her celexa now , will continue treatment.   Daily contact with patient to assess and evaluate symptoms and progress in treatment, Medication management and Plan see below   Celexa  15 mg po daily for affective sx. Will continue Risperidone 1.25 mg po qhs for  augmenting the Celexa. Trazodone 50 mg po qhs prn for insomnia. CSW will continue to work on disposition - she will need intensive therapy - possibly CBT.     Tylik Treese, MD 06/22/2016, 4:33 PM

## 2016-06-22 NOTE — Progress Notes (Signed)
Recreation Therapy Notes  Date: 06/22/16 Time: 0930 Location: [address] Dayroom  Group Topic: Stress Management  Goal Area(s) Addresses:  Patient will verbalize importance of using healthy stress management.  Patient will identify positive emotions associated with healthy stress management.   Intervention: Stress Managemen  Activity :  LRT introduced the stress management technique of guided imagery.  LRT read a script to allow patients to engage in the activity.  Patients were to follow along as LRT read script to participate in activity.  Education:  Stress Management, Discharge Planning.   Education Outcome: Acknowledges edcuation/In group clarification offered/Needs additional education  Clinical Observations/Feedback: Pt did not attend group.   Caroll RancherMarjette Delma Villalva, LRT/CTRS         Caroll RancherLindsay, Donte Kary A 06/22/2016 2:41 PM

## 2016-06-23 MED ORDER — RISPERIDONE 1 MG PO TABS
1.2500 mg | ORAL_TABLET | Freq: Once | ORAL | Status: DC
  Filled 2016-06-23: qty 1

## 2016-06-23 NOTE — Progress Notes (Signed)
Deborah Harris. [name] had been in room and in bed for majority of evening. She did not attend evening group activity, and engaged minimally with staff. She refused evening risperdal and reported that she did not need it. She also said that she did not want anything for sleep. She reported that she just needed to lie in bed and get some rest as she was tired. She denied any SI and did not verbalize any complaints of pain. A. Support and encouragement provided. R. Safety maintained, will continue to monitor.

## 2016-06-23 NOTE — Progress Notes (Signed)
D: Patient remains guarded and cautious with staff and peers.  She continues to isolate to her room.  She was reluctant to take her medications this morning because "I haven't had my breakfast."  Patient was given a couple of breakfast bars and agreed to take her celexa.  Patient appears preoccupied and has an intense gaze.  She denies any thoughts of self harm or auditory hallucinations.  Patient has been refusing her risperidone at nighttime.  She is focused on discharge. Patient's parents have been visiting are very supportive. A: Continue to monitor medication management and MD orders.  Safety checks completed every 15 minutes per protocol.  Offer support and encouragement as needed. R: Patient has minimal interaction with staff and peers.  She elects to isolate to her room.

## 2016-06-23 NOTE — Progress Notes (Signed)
Adult Psychoeducational Group Note  Date:  06/23/2016 Time:  10:46 AM  Group Topic/Focus:  Goals Group:   The focus of this group is to help patients establish daily goals to achieve during treatment and discuss how the patient can incorporate goal setting into their daily lives to aide in recovery.  Participation Level:  Active  Participation Quality:  Appropriate  Affect:  Anxious and Appropriate  Cognitive:  Alert and Appropriate  Insight: Good and Improving  Engagement in Group:  Engaged and Improving  Modes of Intervention:  Discussion  Additional Comments:  Pt attended goals group this morning and participated. Pt goal for today is to work on identifying trigger and coping skills for anxiety. Pt rated her day 5/10. Pt stated " I want to learn how control my anxiety and depression. Pt shared she's having anxiety about not knowing her discharge plans. Pt stated " I feel like I am not doing anything productive in life and I feel like I need to stay busy, I plan to go back to school".  Pt was pleasant and appropriate in group. Pt plans to list triggers and coping skills for anxiety.   Charene Mccallister A 06/23/2016, 10:46 AM

## 2016-06-23 NOTE — Progress Notes (Signed)
D: Patient continues to appear preoccupied and suspicious.  She exhibits some thought blocking.  Patient was offered a one time dose of risperidone per MD.  Patient was asked to take the medication per the conversation she had with Dr. Jama Flavorsobos.  Patient asked, "what are the side effects?"  She states, "I'm taking the celexa and I think that is the only one I want to take."  She appeared indecisive and is focused on discharge.  Patient declined the medication and MD was informed.  Patient is isolative and withdrawn with little interaction with staff and peers.  She denies any thoughts of self harm and remains safe on the unit. A: Continue to monitor medication management and MD orders.  Safety checks completed every 15 minutes per protocol.  Offer support and encouragement as needed. R: Patient is resistant to treatment; her behavior is appropriate.  She is isolative and withdrawn.

## 2016-06-23 NOTE — [facility] Group Notes (Signed)
[facility] LCSW Group Therapy 06/23/2016 1:15pm  Type of Therapy: Group Therapy- [facility]  Pt did not attend, declined invitation.   Takako Minckler, LCSW 06/23/2016 3:25 PM  

## 2016-06-23 NOTE — Progress Notes (Signed)
[facility]  06/23/2016 3:14 PM Deborah GermanDaniela Harris  MRN:  [id number] Subjective:  Patient reports she is feeling better . She states that she " knows that when I stay quiet and to myself its not good, so I am trying to talk more". Denies medication side effects.   Objective:Patient seen and chart reviewed.Discussed patient with treatment team.  Patient is a 21 year old female admitted on 6/9. Presented for depression,catatonic symptoms, minimal eating and drinking . At this time is improved compared to admission. She presents vaguely guarded at beginning of session, but seemed more comfortable as session progressed. Staff reports that patient is improved but has continued to present guarded and at times appearing internally preoccupied, presenting with intense gaze . She is not currently presenting with symptoms of catatonia, is verbal, interactive. Gait is steady. She states she is eating better. Of note, patient has needed encouragement from staff to take medications,and has been more amenable to take antidepressant ( Celexa ) than the antipsychotic ( Risperidone)  She denies medication side effects. No disruptive behaviors on unit - she tends to isolate and interactions with peers is limited       Principal Problem: MDD (major depressive disorder), single episode, severe with psychosis (HCC) Diagnosis:   Patient Active Problem List   Diagnosis Date Noted  . 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 Major depressive disorder with psychotic features (HCC) [F32.3] 06/14/2016  . Acne [L70.9] 05/21/2012  . Ovarian cyst, right [N83.201] 02/27/2012   Total Time spent with patient: 20 minutes  Past Psychiatric History:Please see H&P.   Past Medical History: Please see H&P.  Family History:  Family History  Problem Relation Age of Onset  . Diabetes Maternal Grandmother    Family Psychiatric  History: denies  Social  History: Lives with mother and two sisters, one in HS , other only 2 yr old , mother is employed, she has a boyfriend , she wants to get back to college , may be [employer] . History  Alcohol Use No     History  Drug Use No    Social History   Social History  . Marital status: Single    Spouse name: N/A  . Number of children: N/A  . Years of education: N/A   Social History Main Topics  . Smoking status: Never Smoker  . Smokeless tobacco: Never Used  . Alcohol use No  . Drug use: No  . Sexual activity: Yes   Other Topics Concern  . None   Social History Narrative  . None   Additional Social History:   Sleep: improving   Appetite:  Fair  Current Medications: Current Facility-Administered Medications  Medication Dose Route Frequency Provider Last Rate Last Dose  . acetaminophen (TYLENOL) tablet 650 mg  650 mg Oral Q6H PRN Oneta RackLewis, Tanika N, NP      . alum & mag hydroxide-simeth (MAALOX/MYLANTA) 200-200-20 MG/5ML suspension 30 mL  30 mL Oral Q4H PRN Oneta RackLewis, Tanika N, NP      . citalopram (CELEXA) tablet 15 mg  15 mg Oral Daily Eappen, Levin BaconSaramma, MD   15 mg at 06/23/16 0820  . LORazepam (ATIVAN) tablet 1 mg  1 mg Oral Q6H PRN Eappen, Saramma, MD      . magnesium hydroxide (MILK OF MAGNESIA) suspension 30 mL  30 mL Oral Daily PRN Oneta RackLewis, Tanika N, NP      . risperiDONE (RISPERDAL) tablet 1.25 mg  1.25 mg  Oral Q2000 Jomarie Longs, MD      . traZODone (DESYREL) tablet 50 mg  50 mg Oral QHS PRN Oneta Rack, NP   50 mg at 06/22/16 0014    Lab Results:  No results found for this or any previous visit (from the past 48 hour(s)).  Blood Alcohol level:  Lab Results  Component Value Date   ETH <5 06/13/2016    Metabolic Disorder Labs: No results found for: HGBA1C, MPG No results found for: PROLACTIN Lab Results  Component Value Date   CHOL 127 06/19/2016   TRIG 34 06/19/2016   HDL 49 06/19/2016   CHOLHDL 2.6 06/19/2016   VLDL 7 06/19/2016   LDLCALC 71 06/19/2016     Physical Findings: AIMS: Facial and Oral Movements Muscles of Facial Expression: None, normal Lips and Perioral Area: None, normal Jaw: None, normal Tongue: None, normal,Extremity Movements Upper (arms, wrists, hands, fingers): None, normal Lower (legs, knees, ankles, toes): None, normal, Trunk Movements Neck, shoulders, hips: None, normal, Overall Severity Severity of abnormal movements (highest score from questions above): None, normal Incapacitation due to abnormal movements: None, normal Patient's awareness of abnormal movements (rate only patient's report): No Awareness, Dental Status Current problems with teeth and/or dentures?: No Does patient usually wear dentures?: No  CIWA:  CIWA-Ar Total: 1 COWS:  COWS Total Score: 2  Musculoskeletal: Strength & Muscle Tone: within normal limits Gait & Station: normal Patient leans: N/A  Psychiatric Specialty Exam: Physical Exam  Nursing note and vitals reviewed.   Review of Systems  Psychiatric/Behavioral: Positive for depression. The patient is nervous/anxious.   All other systems reviewed and are negative. denies chest pain, denies shortness of breath, denies vomiting  Blood pressure (!) 105/59, pulse (!) 127, temperature 98.4 F (36.9 C), temperature source Oral, resp. rate 16, height 5' 5.5" (1.664 m), weight 42.9 kg (94 lb 9.6 oz).Body mass index is 15.5 kg/m.  General Appearance: improved grooming, vaguely guarded   Eye Contact:  Good - has had episodes of intense staring   Speech:  Normal Rate  Volume:  Decreased  Mood:  minimizes depression today, states her mood is "OK"  Affect:  more reactive  Thought Process:  Linear and Descriptions of Associations: Circumstantial  Orientation:  Other:  fully alert and attentive  Thought Content:  denies hallucinations, and does not appear internally preoccupied at this time  Suicidal Thoughts:  No- denies suicidal or self injurious ideations, denies homicidal or violent  ideations  Homicidal Thoughts:  No  Memory: recent and remote fair  Judgement:  Fair  Insight:  Shallow  Psychomotor Activity:  improving, more reactive  Concentration:  Concentration: Good and Attention Span: Good  Recall:  Fair  Fund of Knowledge:  Good  Language:  improving  Akathisia:  No  Handed:  Right  AIMS (if indicated):     Assets:  Desire for Improvement  ADL's:  Intact  Cognition:  WNL  Sleep:  Number of Hours: 6.75   Assessment - patient is presenting with gradual improvement compared to admission- at this time not presenting catatonic and exhibiting improved range of affect. Denies SI. Insight remains limited, but after we reviewed indications for medications, including Risperidone, she stated she would take it ( has been refusing Risperidone )    Treatment Plan Summary: Treatment plan reviewed as below today 6/14   Daily contact with patient to assess and evaluate symptoms and progress in treatment, Medication management and Plan see below  Encourage group and milieu participation  to work on Pharmacologist and symptom reduction Continue Celexa  15 mg po daily for affective sx. Continue Risperidone 1.25 mg po qhs for psychotic symptoms and augmentation Continue Trazodone 50 mg po qhs prn for insomnia. Continue Ativan 1 mgr Q 6 hours prn for anxiety as needed  Treatment team working on disposition planning options.     Craige Cotta, MD 06/23/2016, 3:14 PM  Patient ID: Deborah Harris, female   DOB: 06-13-1995, 21 y.o.   MRN: [id number]

## 2016-06-24 NOTE — [facility] Group Notes (Signed)
[facility] LCSW Group Therapy 06/24/2016 1:15pm  Type of Therapy: Group Therapy- Feelings Around Relapse and Recovery  Participation Level: Minimal  Participation Quality:  N/A  Affect:  Blunted  Cognitive: Alert and Oriented   Insight:  Unable to assess  Engagement in Therapy: Minimal  Modes of Intervention: Clarification, Confrontation, Discussion, Education, Exploration, Limit-setting, Orientation, Problem-solving, Rapport Building, Dance movement psychotherapisteality Testing, Socialization and Support  Summary of Progress/Problems: The topic for today was feelings about relapse. The group discussed what relapse prevention is to them and identified triggers that they are on the path to relapse. Members also processed their feeling towards relapse and were able to relate to common experiences. Group also discussed coping skills that can be used for relapse prevention.  Pt arrived late and did not participate in group discussion.   Therapeutic Modalities:   Cognitive Behavioral Therapy Solution-Focused Therapy Assertiveness Training Relapse Prevention Therapy    Damien FusiLauren Fernand Sorbello, LCSW [phone] 06/24/2016 4:50 PM

## 2016-06-24 NOTE — Progress Notes (Signed)
Nursing Note 06/24/2016 [id number]  Data Reports sleeping well.  Did not complete self-inventory sheet.Affect blunted.  Patient focused on discharging today claiming this AM that she was supposed to leave.  When she found out she was not after talking to the MD she petitioned the nurse about leaving today "he said I could leave tomorrow."  Patient withdrawn but attending groups. Denies HI, SI, AVH.   Action Spoke with patient 1:1, nurse offered support to patient throughout shift.  Continues to be monitored on 15 minute checks for safety.  Response Remains safe on unit.

## 2016-06-24 NOTE — Progress Notes (Signed)
[facility] MD Progress Note  06/24/2016 4:04 PM Deborah Harris  MRN:  [id number] Subjective:  Patient states, "I'm doing much better today". Patient denies medication side effects.   Objective: Patient seen with Dr. Jama Flavors and chart reviewed. Patient alert and oriented x4. She reports doing much better today and feeling better also. Stated that she is tolerating the Celexa very well but that she does not want to take the Risperdal because she just wanted to be on one medication only. Patient reports some improvement with appetite and denies any emesis. Said she has been going for meals without any problems. Patient stated that she spoke with her grandmother who also feels that patient is doing well and might be ready for discharge today. Patient continues to lack insight as to why she might need more than one medication to get her stabilized. Pt's is obviously preoccupied with going home and continuing her med at home. Patient unsure of what her future goals will be as far as her education. Patient is not showing any sign of catatonia today but appears suspicious with thought blocking.    Principal Problem: MDD (major depressive disorder), single episode, severe with psychosis (HCC) Diagnosis:   Patient Active Problem List   Diagnosis Date Noted  . 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 Major depressive disorder with psychotic features (HCC) [F32.3] 06/14/2016  . Acne [L70.9] 05/21/2012  . Ovarian cyst, right [N83.201] 02/27/2012   Total Time spent with patient: 20 minutes  Past Psychiatric History:Please see H&P.   Past Medical History: Please see H&P.  Family History:  Family History  Problem Relation Age of Onset  . Diabetes Maternal Grandmother    Family Psychiatric  History: denies  Social History: Lives with mother and two sisters, one in HS , other only 2 yr old , mother is employed, she has a boyfriend , she wants to get back to  college , may be [employer] . History  Alcohol Use No     History  Drug Use No    Social History   Social History  . Marital status: Single    Spouse name: N/A  . Number of children: N/A  . Years of education: N/A   Social History Main Topics  . Smoking status: Never Smoker  . Smokeless tobacco: Never Used  . Alcohol use No  . Drug use: No  . Sexual activity: Yes   Other Topics Concern  . None   Social History Narrative  . None   Additional Social History:   Sleep: improving   Appetite:  Fair  Current Medications: Current Facility-Administered Medications  Medication Dose Route Frequency Provider Last Rate Last Dose  . acetaminophen (TYLENOL) tablet 650 mg  650 mg Oral Q6H PRN Oneta Rack, NP      . alum & mag hydroxide-simeth (MAALOX/MYLANTA) 200-200-20 MG/5ML suspension 30 mL  30 mL Oral Q4H PRN Oneta Rack, NP      . citalopram (CELEXA) tablet 15 mg  15 mg Oral Daily Jomarie Longs, MD   15 mg at 06/24/16 0842  . LORazepam (ATIVAN) tablet 1 mg  1 mg Oral Q6H PRN Eappen, Saramma, MD      . magnesium hydroxide (MILK OF MAGNESIA) suspension 30 mL  30 mL Oral Daily PRN Lewis, Tanika N, NP      . risperiDONE (RISPERDAL) tablet 1.25 mg  1.25 mg Oral Q2000 Eappen, Saramma, MD      . risperiDONE (RISPERDAL)  tablet 1.25 mg  1.25 mg Oral Once Cobos, Rockey SituFernando A, MD      . traZODone (DESYREL) tablet 50 mg  50 mg Oral QHS PRN Oneta RackLewis, Tanika N, NP   50 mg at 06/22/16 0014    Lab Results:  No results found for this or any previous visit (from the past 48 hour(s)).  Blood Alcohol level:  Lab Results  Component Value Date   ETH <5 06/13/2016    Metabolic Disorder Labs: No results found for: HGBA1C, MPG No results found for: PROLACTIN Lab Results  Component Value Date   CHOL 127 06/19/2016   TRIG 34 06/19/2016   HDL 49 06/19/2016   CHOLHDL 2.6 06/19/2016   VLDL 7 06/19/2016   LDLCALC 71 06/19/2016    Physical Findings: AIMS: Facial and Oral Movements Muscles  of Facial Expression: None, normal Lips and Perioral Area: None, normal Jaw: None, normal Tongue: None, normal,Extremity Movements Upper (arms, wrists, hands, fingers): None, normal Lower (legs, knees, ankles, toes): None, normal, Trunk Movements Neck, shoulders, hips: None, normal, Overall Severity Severity of abnormal movements (highest score from questions above): None, normal Incapacitation due to abnormal movements: None, normal Patient's awareness of abnormal movements (rate only patient's report): No Awareness, Dental Status Current problems with teeth and/or dentures?: No Does patient usually wear dentures?: No  CIWA:  CIWA-Ar Total: 1 COWS:  COWS Total Score: 2  Musculoskeletal: Strength & Muscle Tone: within normal limits Gait & Station: normal Patient leans: N/A  Psychiatric Specialty Exam: Physical Exam  Nursing note and vitals reviewed.   Review of Systems  Psychiatric/Behavioral: Positive for depression. The patient is nervous/anxious.   All other systems reviewed and are negative. denies chest pain, denies shortness of breath, denies vomiting  Blood pressure 123/60, pulse (!) 103, temperature 98.6 F (37 C), temperature source Oral, resp. rate 18, height 5' 5.5" (1.664 m), weight 42.9 kg (94 lb 9.6 oz).Body mass index is 15.5 kg/m.  General Appearance: improved grooming, vaguely guarded   Eye Contact:  Good - has had episodes of intense staring   Speech:  Normal Rate  Volume:  Decreased  Mood:  minimizes depression today, states her mood is "OK"  Affect:  more reactive  Thought Process:  Linear and Descriptions of Associations: Circumstantial  Orientation:  Other:  fully alert and attentive  Thought Content:  denies hallucinations, and does not appear internally preoccupied at this time  Suicidal Thoughts:  No- denies suicidal or self injurious ideations, denies homicidal or violent ideations  Homicidal Thoughts:  No  Memory: recent and remote fair   Judgement:  Fair  Insight:  Shallow  Psychomotor Activity:  improving, more reactive  Concentration:  Concentration: Good and Attention Span: Good  Recall:  Fair  Fund of Knowledge:  Good  Language:  improving  Akathisia:  No  Handed:  Right  AIMS (if indicated):     Assets:  Desire for Improvement  ADL's:  Intact  Cognition:  WNL  Sleep:  Number of Hours: 6.5   Assessment - patient is presenting with gradual improvement compared to admission- at this time not presenting catatonic and exhibiting improved range of affect. Denies SI. Insight remains limited. Patient remain partially complaint with her medication, continues to refuse Risperidone.   Treatment Plan Summary:  Continue Treatment plan as initiated on 6/14:  Daily contact with patient to assess and evaluate symptoms and progress in treatment, Medication management and Plan see below  Encourage group and milieu participation to work on coping skills  and symptom reduction Continue Celexa  15 mg po daily for affective sx. Continue Risperidone 1.25 mg po qhs for psychotic symptoms and augmentation Continue Trazodone 50 mg po qhs prn for insomnia. Continue Ativan 1 mgr Q 6 hours prn for anxiety as needed  Treatment team working on disposition planning options.  Addendum -Will consider possible discharge over the weekend per patient's request and assessment findings.    Delila Pereyra, NP 06/24/2016, 4:04 PM  Patient ID: Deborah Harris, female   DOB: 29-Sep-1995, 21 y.o.   MRN: [id number]  I have seen patient along with NP and agree with NP Note and Assessment . Patient presents improved,and at this time minimizes depression. She denies suicidal ideations. She has no ongoing symptoms of catatonia. There is no rigidity or decrease in movements, she presents with a full and reactive affect , and she is verbal and communicative . She does remain vaguely suspicious, but there are no overt psychotic symptoms. She is accepting  Celexa, but is refusing Risperidone, states " I am just going to take one medication" . Denies medication side effects. I have spoken with mother who corroborates patient is much improved and agrees with discharge planning .  Plan is to discharge patient home Saturday as she continues to stabilize and improve . Sallyanne Havers , MD

## 2016-06-24 NOTE — [facility] Suicide Risk Assessment (Signed)
[facility] INPATIENT:  Family/Significant Other Suicide Prevention Education  Suicide Prevention Education:  Education Completed; Deborah Harris, Pt's grandmother [phone], has been identified by the patient as the family member/significant other with whom the patient will be residing, and identified as the person(s) who will aid the patient in the event of a mental health crisis (suicidal ideations/suicide attempt).  With written consent from the patient, the family member/significant other has been provided the following suicide prevention education, prior to the and/or following the discharge of the patient.  The suicide prevention education provided includes the following:  Suicide risk factors  Suicide prevention and interventions  National Suicide Hotline telephone number  [facility] assessment telephone number  [facility] Emergency Assistance 911  [facility] and/or Residential Mobile Crisis Unit telephone number  Request made of family/significant other to:  Remove weapons (e.g., guns, rifles, knives), all items previously/currently identified as safety concern.    Remove drugs/medications (over-the-counter, prescriptions, illicit drugs), all items previously/currently identified as a safety concern.  The family member/significant other verbalizes understanding of the suicide prevention education information provided.  The family member/significant other agrees to remove the items of safety concern listed above.  Pt's grandmother reports that she feels that Pt is making progress and will be glad to have her home. Per grandmother, she will be supportive of Pt seeking outpatient resources.   Deborah Harris 06/24/2016, 11:29 AM

## 2016-06-24 NOTE — Progress Notes (Signed)
  [facility] :  Will you be returning to the same living situation after discharge:  Yes,  pt returning home with family At discharge, do you have transportation home?: Yes,  Pt boyfriend to pick up Do you have the ability to pay for your medications: Yes,  Pt provided with samples and prescriptions  Release of information consent forms completed and in the chart;  Patient's signature needed at discharge.  Patient to Follow up at: Follow-up Information    [facility] Follow up on 06/30/2016.   Specialty:  Behavioral Health Why:  Thursday at 10:00  for your hospital follow up appointment. Bring your ID and your hospital d/c paperwork Contact information: [address] [place] [address] [phone]           Next level of care provider has access to [facility] Link:no  Safety Planning and Suicide Prevention discussed: Yes,  with mother and grandmother; see SPE notes  Have you used any form of tobacco in the last 30 days? (Cigarettes, Smokeless Tobacco, Cigars, and/or Pipes): No  Has patient been referred to the Quitline?: N/A patient is not a smoker  Patient has been referred for addiction treatment: Yes  Verdene LennertLauren C Elmor Kost 06/24/2016, 4:33 PM

## 2016-06-24 NOTE — Tx Team (Signed)
Interdisciplinary Treatment and Diagnostic Plan Update  06/24/2016 Time of Session: 11:35 AM  Deborah Harris MRN: [id number]  Principal Diagnosis: MDD (major depressive disorder), single episode, severe with psychosis (HCC)  Secondary Diagnoses: Principal Problem:   MDD (major depressive disorder), single episode, severe with psychosis (HCC)   Current Medications:  Current Facility-Administered Medications  Medication Dose Route Frequency Provider Last Rate Last Dose  . acetaminophen (TYLENOL) tablet 650 mg  650 mg Oral Q6H PRN Oneta Rack, NP      . alum & mag hydroxide-simeth (MAALOX/MYLANTA) 200-200-20 MG/5ML suspension 30 mL  30 mL Oral Q4H PRN Oneta Rack, NP      . citalopram (CELEXA) tablet 15 mg  15 mg Oral Daily Jomarie Longs, MD   15 mg at 06/24/16 0842  . LORazepam (ATIVAN) tablet 1 mg  1 mg Oral Q6H PRN Eappen, Saramma, MD      . magnesium hydroxide (MILK OF MAGNESIA) suspension 30 mL  30 mL Oral Daily PRN Oneta Rack, NP      . risperiDONE (RISPERDAL) tablet 1.25 mg  1.25 mg Oral Q2000 Eappen, Saramma, MD      . risperiDONE (RISPERDAL) tablet 1.25 mg  1.25 mg Oral Once Cobos, Rockey Situ, MD      . traZODone (DESYREL) tablet 50 mg  50 mg Oral QHS PRN Oneta Rack, NP   50 mg at 06/22/16 0014    PTA Medications: No prescriptions prior to admission.    Treatment Modalities: Medication Management, Group therapy, Case management,  1 to 1 session with clinician, Psychoeducation, Recreational therapy.   Physician Treatment Plan for Primary Diagnosis: MDD (major depressive disorder), single episode, severe with psychosis (HCC) Long Term Goal(s): Improvement in symptoms so as ready for discharge  Short Term Goals: Ability to identify changes in lifestyle to reduce recurrence of condition will improve Ability to verbalize feelings will improve Ability to identify and develop effective coping behaviors will improve Ability to identify changes in lifestyle to  reduce recurrence of condition will improve Ability to verbalize feelings will improve Ability to identify and develop effective coping behaviors will improve Ability to identify triggers associated with substance abuse/mental health issues will improve  Medication Management: Evaluate patient's response, side effects, and tolerance of medication regimen.  Therapeutic Interventions: 1 to 1 sessions, Unit Group sessions and Medication administration.  Evaluation of Outcomes: Adequate for Discharge  Physician Treatment Plan for Secondary Diagnosis: Principal Problem:   MDD (major depressive disorder), single episode, severe with psychosis (HCC)   Long Term Goal(s): Improvement in symptoms so as ready for discharge  Short Term Goals: Ability to identify changes in lifestyle to reduce recurrence of condition will improve Ability to verbalize feelings will improve Ability to identify and develop effective coping behaviors will improve Ability to identify changes in lifestyle to reduce recurrence of condition will improve Ability to verbalize feelings will improve Ability to identify and develop effective coping behaviors will improve Ability to identify triggers associated with substance abuse/mental health issues will improve  Medication Management: Evaluate patient's response, side effects, and tolerance of medication regimen.  Therapeutic Interventions: 1 to 1 sessions, Unit Group sessions and Medication administration.  Evaluation of Outcomes: Adequate for Discharge   RN Treatment Plan for Primary Diagnosis: MDD (major depressive disorder), single episode, severe with psychosis (HCC) Long Term Goal(s): Knowledge of disease and therapeutic regimen to maintain health will improve  Short Term Goals: Ability to identify and develop effective coping behaviors will improve and Compliance with  prescribed medications will improve  Medication Management: RN will administer medications as  ordered by provider, will assess and evaluate patient's response and provide education to patient for prescribed medication. RN will report any adverse and/or side effects to prescribing provider.  Therapeutic Interventions: 1 on 1 counseling sessions, Psychoeducation, Medication administration, Evaluate responses to treatment, Monitor vital signs and CBGs as ordered, Perform/monitor CIWA, COWS, AIMS and Fall Risk screenings as ordered, Perform wound care treatments as ordered.  Evaluation of Outcomes: Adequate for Discharge   LCSW Treatment Plan for Primary Diagnosis: MDD (major depressive disorder), single episode, severe with psychosis (HCC) Long Term Goal(s): Safe transition to appropriate next level of care at discharge, Engage patient in therapeutic group addressing interpersonal concerns.  Short Term Goals: Engage patient in aftercare planning with referrals and resources  Therapeutic Interventions: Assess for all discharge needs, 1 to 1 time with Social worker, Explore available resources and support systems, Assess for adequacy in community support network, Educate family and significant other(s) on suicide prevention, Complete Psychosocial Assessment, Interpersonal group therapy.  Evaluation of Outcomes: Adequate for Discharge   Return home, follow up [facility]    Progress in Treatment: Attending groups: Yes Participating in groups: Yes Taking medication as prescribed: Yes Toleration medication: Yes, no side effects reported at this time Family/Significant other contact made: Yes with grandmother and mother Patient understands diagnosis: No  Limited insight Discussing patient identified problems/goals with staff: Yes Medical problems stabilized or resolved: Yes Denies suicidal/homicidal ideation: Yes Issues/concerns per patient self-inventory: None Other: N/A  New problem(s) identified: None identified at this time.   New Short Term/Long Term Goal(s): None identified at this  time.   Discharge Plan or Barriers: Pt will return home and follow-up with outpatient services.   Reason for Continuation of Hospitalization: Paranoia Medication stabilization  Estimated Length of Stay: Est DC date of 6/16  Attendees: Patient: 06/24/2016  11:35 AM  Physician: Dr. Jama Flavorsobos, MD 06/24/2016  11:35 AM  Nursing: Stacy Gardneran, Patty, RN 06/24/2016  11:35 AM  RN Care Manager:  06/24/2016  11:35 AM  Social Worker: Vernie ShanksLauren Jawan Chavarria, LCSW 06/24/2016  11:35 AM  Recreational Therapist:  06/24/2016  11:35 AM  Other: Armandina StammerAgnes Nwoko, NP 06/24/2016  11:35 AM  Other:  06/24/2016  11:35 AM    Scribe for Treatment Team: Vernie ShanksLauren Lakoda Raske, LCSW Clinical Social Work [phone]

## 2016-06-25 NOTE — Progress Notes (Signed)
D: Pt denies SI/HI/AVH. Pt noted in room most of shift. Patient was pleasant and cooperative. Pt was able to state two coping skills she could utilize when she goes home for her anxiety. A: Pt was offered support and encouragement. Pt refused her scheduled medications. Pt was encourage to attend groups. Q 15 minute checks were done for safety.  R:Pt  did not attends groups and interacts well with peers and staff. Pt has no complaints.Pt receptive to treatment and safety maintained on unit.

## 2016-06-25 NOTE — Progress Notes (Signed)
[facility] MD Progress Note  06/25/2016 1:20 PM Deborah Harris  MRN:  [id number] Subjective:  Patient reports " I am feeling better, I ready to discharge home."   Objective: Deborah Harris is awake and alert. Seen attending group session. Reports feeling much better and self aware. Reports she is learning to allow people to love her" the way they know how and not how I expect to be loved." patient reports she is excited for discharge. Reports taking her medication as prescribed without missing dose reports she is tolerating medications well. Support, encouragement and reassurance was provided.    Principal Problem: MDD (major depressive disorder), single episode, severe with psychosis (HCC) Diagnosis:   Patient Active Problem List   Diagnosis Date Noted  . 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 Major depressive disorder with psychotic features (HCC) [F32.3] 06/14/2016  . Acne [L70.9] 05/21/2012  . Ovarian cyst, right [N83.201] 02/27/2012   Total Time spent with patient: 20 minutes  Past Psychiatric History:Please see H&P.   Past Medical History: Please see H&P.  Family History:  Family History  Problem Relation Age of Onset  . Diabetes Maternal Grandmother    Family Psychiatric  History: denies  Social History: Lives with mother and two sisters, one in HS , other only 2 yr old , mother is employed, she has a boyfriend , she wants to get back to college , may be [employer] . History  Alcohol Use No     History  Drug Use No    Social History   Social History  . Marital status: Single    Spouse name: N/A  . Number of children: N/A  . Years of education: N/A   Social History Main Topics  . Smoking status: Never Smoker  . Smokeless tobacco: Never Used  . Alcohol use No  . Drug use: No  . Sexual activity: Yes   Other Topics Concern  . None   Social History Narrative  . None   Additional Social History:   Sleep:  improving   Appetite:  Fair  Current Medications: Current Facility-Administered Medications  Medication Dose Route Frequency Provider Last Rate Last Dose  . acetaminophen (TYLENOL) tablet 650 mg  650 mg Oral Q6H PRN Oneta Rack, NP      . alum & mag hydroxide-simeth (MAALOX/MYLANTA) 200-200-20 MG/5ML suspension 30 mL  30 mL Oral Q4H PRN Oneta Rack, NP      . citalopram (CELEXA) tablet 15 mg  15 mg Oral Daily Jomarie Longs, MD   15 mg at 06/25/16 9147  . LORazepam (ATIVAN) tablet 1 mg  1 mg Oral Q6H PRN Eappen, Saramma, MD      . magnesium hydroxide (MILK OF MAGNESIA) suspension 30 mL  30 mL Oral Daily PRN Oneta Rack, NP      . risperiDONE (RISPERDAL) tablet 1.25 mg  1.25 mg Oral Q2000 Eappen, Saramma, MD      . risperiDONE (RISPERDAL) tablet 1.25 mg  1.25 mg Oral Once Cobos, Rockey Situ, MD      . traZODone (DESYREL) tablet 50 mg  50 mg Oral QHS PRN Oneta Rack, NP   50 mg at 06/22/16 0014    Lab Results:  No results found for this or any previous visit (from the past 48 hour(s)).  Blood Alcohol level:  Lab Results  Component Value Date   ETH <5 06/13/2016    Metabolic Disorder Labs: No results found for: HGBA1C, MPG  No results found for: PROLACTIN Lab Results  Component Value Date   CHOL 127 06/19/2016   TRIG 34 06/19/2016   HDL 49 06/19/2016   CHOLHDL 2.6 06/19/2016   VLDL 7 06/19/2016   LDLCALC 71 06/19/2016    Physical Findings: AIMS: Facial and Oral Movements Muscles of Facial Expression: None, normal Lips and Perioral Area: None, normal Jaw: None, normal Tongue: None, normal,Extremity Movements Upper (arms, wrists, hands, fingers): None, normal Lower (legs, knees, ankles, toes): None, normal, Trunk Movements Neck, shoulders, hips: None, normal, Overall Severity Severity of abnormal movements (highest score from questions above): None, normal Incapacitation due to abnormal movements: None, normal Patient's awareness of abnormal movements (rate  only patient's report): No Awareness, Dental Status Current problems with teeth and/or dentures?: No Does patient usually wear dentures?: No  CIWA:  CIWA-Ar Total: 1 COWS:  COWS Total Score: 2  Musculoskeletal: Strength & Muscle Tone: within normal limits Gait & Station: normal Patient leans: N/A  Psychiatric Specialty Exam: Physical Exam  Nursing note and vitals reviewed. Constitutional: She is oriented to person, place, and time. She appears well-developed.  Cardiovascular: Normal rate.   Neurological: She is alert and oriented to person, place, and time.  Psychiatric: She has a normal mood and affect. Her behavior is normal.    Review of Systems  Psychiatric/Behavioral: Positive for depression. Negative for suicidal ideas. The patient is nervous/anxious.   All other systems reviewed and are negative. denies chest pain, denies shortness of breath, denies vomiting  Blood pressure 123/60, pulse (!) 103, temperature 98.6 F (37 C), temperature source Oral, resp. rate 18, height 5' 5.5" (1.664 m), weight 42.9 kg (94 lb 9.6 oz).Body mass index is 15.5 kg/m.  General Appearance: Casual, Guarded and improved grooming, vaguely guarded   Eye Contact:  Good - has had episodes of intense staring   Speech:  Normal Rate  Volume:  Decreased  Mood:  Anxious and Dysphoric  Affect:  more reactive  Thought Process:  Descriptions of Associations: Circumstantial  Orientation:  Full (Time, Place, and Person)  Thought Content:  denies hallucinations, and does not appear internally preoccupied at this time  Suicidal Thoughts:  No-   Homicidal Thoughts:  No  Memory: recent and remote fair  Judgement:  Fair  Insight:  Shallow  Psychomotor Activity:  improving, more reactive  Concentration:  Concentration: Good and Attention Span: Good  Recall:  Fair  Fund of Knowledge:  Good  Language:  improving  Akathisia:  No  Handed:  Right  AIMS (if indicated):     Assets:  Desire for Improvement   ADL's:  Intact  Cognition:  WNL  Sleep:  Number of Hours: 6.5     I agree with current treatment plan on 06/25/2016, Patient seen face-to-face for psychiatric evaluation follow-up, chart reviewed. Reviewed the information documented and agree with the treatment plan.  Treatment Plan Summary:  Continue Treatment plan as initiated on 06/25/2016 continue as listed excepted where noted  Daily contact with patient to assess and evaluate symptoms and progress in treatment, Medication management and Plan see below  Encourage group and milieu participation to work on coping skills and symptom reduction Continue Celexa  15 mg po daily for affective sx. Continue Risperidone 1.25 mg po qhs for psychotic symptoms and augmentation Continue Trazodone 50 mg po qhs prn for insomnia. Continue Ativan 1 mgr Q 6 hours prn for anxiety as needed  Treatment team working on disposition planning options.  Oneta Rackanika N Lewis, NP 06/25/2016, 1:20 PM

## 2016-06-25 NOTE — Progress Notes (Signed)
Nursing Note 06/25/2016 [id number]  Data Reports sleeping good without PRN sleep med.  Rates depression 0/10, hopelessness 0/10, and anxiety 0/10. Affect blunted.  Denies HI, SI, AVH.  Attending groups, withdrawn in milieu.  Splits free time between room and day area.  Patient disappointed that she wasn't discharging today but encouraged by possibility of discharging tomorrow.   Action Spoke with patient 1:1, nurse offered support to patient throughout shift.  Continues to be monitored on 15 minute checks for safety.  Response Remains safe and appropriate though withdrawn

## 2016-06-25 NOTE — Progress Notes (Signed)
Psychoeducational Group Note  Date:  06/25/2016 Time:  2257  Group Topic/Focus:  Wrap-Up Group:   The focus of this group is to help patients review their daily goal of treatment and discuss progress on daily workbooks.  Participation Level: Did Not Attend  Participation Quality:  Not Applicable  Affect:  Not Applicable  Cognitive:  Not Applicable  Insight:  Not Applicable  Engagement in Group: Not Applicable  Additional Comments:  The patient did not attend group this evening.   Hazle CocaGOODMAN, Jabril Pursell S 06/25/2016, 10:56 PM

## 2016-06-25 NOTE — [facility] Group Notes (Signed)
[facility] LCSW Group Therapy  06/25/2016   Type of Therapy:  Group Therapy  Participation Level:  Active  Participation Quality:  Appropriate and Attentive  Affect:  Appropriate  Cognitive:  Alert and Oriented  Insight:  Improving  Engagement in Therapy:  Improving  Modes of Intervention:  Discussion  Today's group was about swapping emotions based activities. Patient were able to identify things that they did that lead them to negative emotional states. For example, feeling weak, frustrated, foolish, etc. Patients were then able to express thing they did that lead them to positive emotional states. For example, feeling loved, smart, empowered, etc. Patients then were able to process ways in which they could evoke these positive emotional states when dealing with negative emotional states.   Beverly Sessionsywan J Shellene Sweigert MSW, LCSW

## 2016-06-25 NOTE — [facility] Group Notes (Signed)
Life Skills   Date:  06/25/2016  Time:  1000  Type of Therapy:  Nurse Education / Identifying needs: The group focuses on teaching patients how to identfiy their needs as well as develop the skills needed to get them met.    Participation Level:  Active  Participation Quality:  Attentive  Affect:  Appropriate  Cognitive:  Alert  Insight:  Good  Engagement in Group:  Engaged  Modes of Intervention:  Education  Summary of Progress/Problems:  Deborah Harris 06/25/2016, 12:15 PM

## 2016-06-26 DIAGNOSIS — F323 Major depressive disorder, single episode, severe with psychotic features: Secondary | ICD-10-CM

## 2016-06-26 MED ORDER — CITALOPRAM HYDROBROMIDE 10 MG PO TABS: 15.0000 mg | ORAL_TABLET | Freq: Every day | ORAL | 0 refills | Status: AC

## 2016-06-26 MED ORDER — RISPERIDONE 0.25 MG PO TABS: 1.2500 mg | ORAL_TABLET | Freq: Every day | ORAL | 0 refills | Status: AC

## 2016-06-26 MED ORDER — RISPERIDONE 0.25 MG PO TABS: 1.2500 mg | ORAL_TABLET | ORAL | Status: DC

## 2016-06-26 MED ORDER — TRAZODONE HCL 50 MG PO TABS: 50.0000 mg | ORAL_TABLET | Freq: Every evening | ORAL | 0 refills | Status: AC | PRN

## 2016-06-26 NOTE — [facility] Suicide Risk Assessment (Signed)
[facility]   Principal Problem: MDD (major depressive disorder), single episode, severe with psychosis [facility](HCC) Discharge Diagnoses:  Patient Active Problem List   Diagnosis Date Noted  . 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 Major depressive disorder with psychotic features (HCC) [F32.3] 06/14/2016  . Acne [L70.9] 05/21/2012  . Ovarian cyst, right [N83.201] 02/27/2012    Total Time spent with patient: 30 minutes  Musculoskeletal: Strength & Muscle Tone: within normal limits Gait & Station: normal Patient leans: N/A  Psychiatric Specialty Exam: ROS  Blood pressure 127/70, pulse (!) 107, temperature 98.5 F (36.9 C), resp. rate 16, height 5' 5.5" (1.664 m), weight 42.9 kg (94 lb 9.6 oz).Body mass index is 15.5 kg/m.  General Appearance: Casual and shy  Eye Contact::  Good  Speech:  Clear and Coherent and Slow409  Volume:  Normal  Mood:  Anxious  Affect:  Congruent  Thought Process:  Goal Directed  Orientation:  Full (Time, Place, and Person)  Thought Content:  WDL and Logical  Suicidal Thoughts:  No  Homicidal Thoughts:  No  Memory:  Immediate;   Good Recent;   Good Remote;   Good  Judgement:  Good  Insight:  Good  Psychomotor Activity:  Normal  Concentration:  Good  Recall:  Good  Fund of Knowledge:Good  Language: Good  Akathisia:  No  Handed:  Right  AIMS (if indicated):     Assets:  Communication Skills Desire for Improvement Housing Resilience Social Support  Sleep:  Number of Hours: 6.75  Cognition: WNL  ADL's:  Intact   Mental Status Per Nursing Assessment::   On Admission:  Self-harm behaviors  Demographic Factors:  Adolescent or young adult  Loss Factors: Loss of significant relationship  Historical Factors: Impulsivity  Risk Reduction Factors:   Sense of responsibility to family, Religious beliefs about death, Living with another person, especially a  relative, Positive social support, Positive therapeutic relationship and Positive coping skills or problem solving skills  Continued Clinical Symptoms:  Dysthymia  Cognitive Features That Contribute To Risk:  None    Suicide Risk:  Minimal: No identifiable suicidal ideation.  Patients presenting with no risk factors but with morbid ruminations; may be classified as minimal risk based on the severity of the depressive symptoms  Follow-up Information    [facility] Follow up on 06/30/2016.   Specialty:  Behavioral Health Why:  Thursday at 10:00  for your hospital follow up appointment. Bring your ID and your hospital d/c paperwork Contact information: [address] [place] [address] [phone]           Plan Of Care/Follow-up recommendations:  Activity:  as tolerated Diet:  unchanged from the past  Patient seen face-to-face and chart reviewed.  Patient is doing much better with the medication.  She has more insight into her illness and she did participate in the group milieu therapy.  She has no concern from the medication.  [facility]  She will stay with the parents and she acknowledged that they are very supportive.  Patient denies any suicidal thoughts, paranoia, hallucination.  Please see discharge summary for detailed plan of care and recommendations.  Prosper Paff T., MD 06/26/2016, 11:38 AM

## 2016-06-26 NOTE — Progress Notes (Signed)
Discharge note:  Patient discharged home per MD order.  [facility]  Patient has been more interactive in the milieu.  She is pleasant and her mood has improved.  She denies any thoughts of self harm; she denies HI and does not appear to be responding to internal stimuli.  Reviewed AVS/transition record with patient and she indicated understanding.  Patient received prescriptions of her medications.  Patient received all personal belongings from unit and locker.  Patient left ambulatory with family.

## 2016-06-26 NOTE — [facility] Group Notes (Signed)
Life Skills   Date:  06/26/2016  Time:  1000  Type of Therapy:  Nurse Education /  Healthy Support Systems: The group is focused on teaching patients how to develop healthy support systmes that will aide them in their recovery.  Participation Level:  Active  Participation Quality:  Appropriate  Affect:  Appropriate  Cognitive:  Alert  Insight:  Good  Engagement in Group:  Engaged  Modes of Intervention:  Education  Summary of Progress/Problems:  Deborah Harris, Deborah Harris 06/26/2016, 11:45 AM

## 2016-06-26 NOTE — Discharge Summary (Signed)
Physician Discharge Summary Note  Patient:  Deborah Harris is an 21 y.o., female MRN:  [id number] DOB:  20-Sep-1995 Patient phone:  [phone] (home)  Patient address:   [address] [place] [address],  Total Time spent with patient: 30 minutes  Date of Admission:  06/17/2016 Date of Discharge: 06/26/2016  Reason for Admission: per consult notes:21 yo female who presented to the ED with depression and agitation, initially. She evidently got agitated at home and they brought her to the ED. Here she goes in and out of catatonia, minimal eating and drinking. Stares often in space, slow to respond. No homicidal ideations or alcohol/drug abuse. Believes her behaviors are a result of her confessing her sin to God and her boyfriend. Inpatient psychiatric care needed.  Principal Problem: MDD (major depressive disorder), single episode, severe with psychosis [facility]) Discharge Diagnoses: Patient Active Problem List   Diagnosis Date Noted  . 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 Major depressive disorder with psychotic features (HCC) [F32.3] 06/14/2016  . Acne [L70.9] 05/21/2012  . Ovarian cyst, right [N83.201] 02/27/2012    Past Psychiatric History:   Past Medical History: History reviewed. No pertinent past medical history. History reviewed. No pertinent surgical history. Family History:  Family History  Problem Relation Age of Onset  . Diabetes Maternal Grandmother    Family Psychiatric  History:  Social History:  History  Alcohol Use No     History  Drug Use No    Social History   Social History  . Marital status: Single    Spouse name: N/A  . Number of children: N/A  . Years of education: N/A   Social History Main Topics  . Smoking status: Never Smoker  . Smokeless tobacco: Never Used  . Alcohol use No  . Drug use: No  . Sexual activity: Yes   Other Topics Concern  . None   Social  History Narrative  . None   Hospital Course:  Jameisha Stofko was admitted for MDD (major depressive disorder), single episode, severe with psychosis (HCC) and crisis management.  Pt was treated discharged with the medications listed below under Medication List.  Medical problems were identified and treated as needed.  Home medications were restarted as appropriate.  Improvement was monitored by observation and [name] 's daily report of symptom reduction.  Emotional and mental status was monitored by daily self-inventory reports completed by [name] and clinical staff.         Dewayne Jurek was evaluated by the treatment team for stability and plans for continued recovery upon discharge. Deltha Bernales 's motivation was an integral factor for scheduling further treatment. Employment, transportation, bed availability, health status, family support, and any pending legal issues were also considered during hospital stay. Pt was offered further treatment options upon discharge including but not limited to Residential, Intensive Outpatient, and Outpatient treatment.  Ivet Guerrieri will follow up with the services as listed below under Follow Up Information.     Upon completion of this admission the patient was both mentally and medically stable for discharge denying suicidal/homicidal ideation, auditory/visual/tactile hallucinations, delusional thoughts and paranoia.    [name] responded well to treatment with Celexa 15mg  and Risperdal 1.25mg  X 5 tabs without adverse effects. Pt demonstrated improvement without reported or observed adverse effects to the point of stability appropriate for outpatient management. Pertinent labs include: Urinalysis and CMP for which outpatient follow-up is necessary for lab recheck  as mentioned below. Reviewed CBC, CMP, BAL, and UDS; all unremarkable aside from noted exceptions.   Physical Findings: AIMS: Facial and Oral Movements Muscles of Facial Expression:  None, normal Lips and Perioral Area: None, normal Jaw: None, normal Tongue: None, normal,Extremity Movements Upper (arms, wrists, hands, fingers): None, normal Lower (legs, knees, ankles, toes): None, normal, Trunk Movements Neck, shoulders, hips: None, normal, Overall Severity Severity of abnormal movements (highest score from questions above): None, normal Incapacitation due to abnormal movements: None, normal Patient's awareness of abnormal movements (rate only patient's report): No Awareness, Dental Status Current problems with teeth and/or dentures?: No Does patient usually wear dentures?: No  CIWA:  CIWA-Ar Total: 1 COWS:  COWS Total Score: 2  Musculoskeletal: Strength & Muscle Tone: within normal limits Gait & Station: normal Patient leans: N/A  Psychiatric Specialty Exam: See SRA BY MD Physical Exam  Nursing note and vitals reviewed. Constitutional: She is oriented to person, place, and time. She appears well-developed.  Neurological: She is alert and oriented to person, place, and time.  Psychiatric: She has a normal mood and affect. Her behavior is normal.    Review of Systems  Psychiatric/Behavioral: Negative for depression (STABLE), hallucinations and suicidal ideas. The patient is not nervous/anxious (STABLE).     Blood pressure 123/60, pulse (!) 103, temperature 98.6 F (37 C), temperature source Oral, resp. rate 18, height 5' 5.5" (1.664 m), weight 42.9 kg (94 lb 9.6 oz).Body mass index is 15.5 kg/m.    Have you used any form of tobacco in the last 30 days? (Cigarettes, Smokeless Tobacco, Cigars, and/or Pipes): No  Has this patient used any form of tobacco in the last 30 days? (Cigarettes, Smokeless Tobacco, Cigars, and/or Pipes)  No  Blood Alcohol level:  Lab Results  Component Value Date   ETH <5 06/13/2016    Metabolic Disorder Labs:  No results found for: HGBA1C, MPG No results found for: PROLACTIN Lab Results  Component Value Date   CHOL 127  06/19/2016   TRIG 34 06/19/2016   HDL 49 06/19/2016   CHOLHDL 2.6 06/19/2016   VLDL 7 06/19/2016   LDLCALC 71 06/19/2016    See Psychiatric Specialty Exam and Suicide Risk Assessment completed by Attending Physician prior to discharge.  Discharge destination:  Home  Is patient on multiple antipsychotic therapies at discharge:  No, just Risperidone    Has Patient had three or more failed trials of antipsychotic monotherapy by history:  No  Recommended Plan for Multiple Antipsychotic Therapies: NA  Discharge Instructions    Diet - low sodium heart healthy    Complete by:  As directed    Discharge instructions    Complete by:  As directed    Take all medications as prescribed. Keep all follow-up appointments as scheduled.  Do not consume alcohol or use illegal drugs while on prescription medications. Report any adverse effects from your medications to your primary care provider promptly.  In the event of recurrent symptoms or worsening symptoms, call 911, a crisis hotline, or go to the nearest emergency department for evaluation.   Increase activity slowly    Complete by:  As directed      Allergies as of 06/26/2016   No Known Allergies     Medication List    TAKE these medications     Indication  citalopram 10 MG tablet Commonly known as:  CELEXA Take 1.5 tablets (15 mg total) by mouth daily. Start taking on:  06/27/2016  Indication:  Aggressive Behavior, Depression  risperiDONE 0.25 MG tablet Commonly known as:  RISPERDAL Take 5 tablets (1.25 mg total) by mouth daily at 8 pm.  Indication:  Psychosis, mood   traZODone 50 MG tablet Commonly known as:  DESYREL Take 1 tablet (50 mg total) by mouth at bedtime as needed for sleep.  Indication:  Trouble Sleeping      Follow-up Information    [facility] Follow up on 06/30/2016.   Specialty:  Behavioral Health Why:  Thursday at 10:00  for your hospital follow up appointment. Bring your ID and your hospital d/c  paperwork Contact information: [address] [place] [address] [phone]           Follow-up recommendations:  Activity:  as tolerated Diet:  heart healthy  Comments: Take all medications as prescribed. Keep all follow-up appointments as scheduled.  Do not consume alcohol or use illegal drugs while on prescription medications. Report any adverse effects from your medications to your primary care provider promptly.  In the event of recurrent symptoms or worsening symptoms, call 911, a crisis hotline, or go to the nearest emergency department for evaluation.   Signed: Oneta Rack, NP 06/26/2016, 8:46 AM

## 2016-06-26 NOTE — Progress Notes (Signed)
D: Pt was flat isolative and withdrawn to self. Pt is preoccupied about going home. "I hope I will be able to go home tomorrow; I have been taking my medications." Pt at the time of assessment denied depression, anxiety, SI, HI, Pain or AVH A: Medications offered as prescribed. All patient's questions and concerns addressed. Support, encouragement, and safe environment provided. 15-minute safety checks continue. R: Pt was med compliant.  Pt did not attend wrap-up group. Safety checks continue.

## 2020-11-20 ENCOUNTER — Emergency Department (HOSPITAL_COMMUNITY): Payer: BC Managed Care – PPO

## 2020-11-20 ENCOUNTER — Emergency Department (HOSPITAL_COMMUNITY)
Admission: EM | Admit: 2020-11-20 | Discharge: 2020-11-21 | Disposition: A | Discharge status: Home / Self Care | Payer: BC Managed Care – PPO | Attending: Emergency Medicine | Admitting: Emergency Medicine

## 2020-11-20 ENCOUNTER — Other Ambulatory Visit: Payer: Self-pay

## 2020-11-20 ENCOUNTER — Encounter (HOSPITAL_COMMUNITY): Payer: Self-pay | Admitting: Emergency Medicine

## 2020-11-20 DIAGNOSIS — S3992XA Unspecified injury of lower back, initial encounter: Secondary | ICD-10-CM | POA: Diagnosis not present

## 2020-11-20 DIAGNOSIS — Y92410 Unspecified street and highway as the place of occurrence of the external cause: Secondary | ICD-10-CM | POA: Diagnosis not present

## 2020-11-20 DIAGNOSIS — Y9389 Activity, other specified: Secondary | ICD-10-CM | POA: Insufficient documentation

## 2020-11-20 LAB — POC URINE PREG, ED: Preg Test, Ur: NEGATIVE
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
## 2020-11-21 ENCOUNTER — Emergency Department (HOSPITAL_COMMUNITY): Payer: BC Managed Care – PPO

## 2020-11-21 ENCOUNTER — Encounter (HOSPITAL_COMMUNITY): Payer: Self-pay | Admitting: Emergency Medicine

## 2020-11-21 MED ORDER — LIDOCAINE 5 % EX PTCH: 1.0000 | MEDICATED_PATCH | CUTANEOUS | 0 refills | Status: AC

## 2020-11-21 MED ORDER — IBUPROFEN 800 MG PO TABS
800.0000 mg | ORAL_TABLET | Freq: Once | ORAL | Status: AC
Start: 2020-11-21 — End: 2020-11-21
  Administered 2020-11-21: 800 mg via ORAL
  Filled 2020-11-21: qty 1

## 2020-11-21 MED ORDER — ACETAMINOPHEN 500 MG PO TABS
1000.0000 mg | ORAL_TABLET | Freq: Once | ORAL | Status: AC
  Administered 2020-11-21: 1000 mg via ORAL
  Filled 2020-11-21: qty 2

## 2020-11-21 MED ORDER — IBUPROFEN 800 MG PO TABS: 800.0000 mg | ORAL_TABLET | Freq: Three times a day (TID) | ORAL | 0 refills | Status: AC
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
